# Patient Record
Sex: Female | Born: 1975 | Race: White | Hispanic: No | Smoking: Current every day smoker
Health system: Southern US, Community
[De-identification: ages and names within clinical notes are randomized; demographics above are authoritative.]

## PROBLEM LIST (undated history)

## (undated) DIAGNOSIS — F17200 Nicotine dependence, unspecified, uncomplicated: Secondary | ICD-10-CM

## (undated) DIAGNOSIS — R198 Other specified symptoms and signs involving the digestive system and abdomen: Secondary | ICD-10-CM

## (undated) DIAGNOSIS — F514 Sleep terrors [night terrors]: Secondary | ICD-10-CM

## (undated) DIAGNOSIS — M549 Dorsalgia, unspecified: Secondary | ICD-10-CM

## (undated) DIAGNOSIS — F3289 Other specified depressive episodes: Secondary | ICD-10-CM

## (undated) DIAGNOSIS — R1013 Epigastric pain: Secondary | ICD-10-CM

## (undated) DIAGNOSIS — N912 Amenorrhea, unspecified: Secondary | ICD-10-CM

## (undated) DIAGNOSIS — A5901 Trichomonal vulvovaginitis: Secondary | ICD-10-CM

## (undated) DIAGNOSIS — E785 Hyperlipidemia, unspecified: Secondary | ICD-10-CM

## (undated) DIAGNOSIS — K3189 Other diseases of stomach and duodenum: Secondary | ICD-10-CM

## (undated) DIAGNOSIS — F329 Major depressive disorder, single episode, unspecified: Secondary | ICD-10-CM

## (undated) DIAGNOSIS — F419 Anxiety disorder, unspecified: Secondary | ICD-10-CM

## (undated) DIAGNOSIS — C569 Malignant neoplasm of unspecified ovary: Secondary | ICD-10-CM

## (undated) DIAGNOSIS — Z6830 Body mass index (BMI) 30.0-30.9, adult: Secondary | ICD-10-CM

## (undated) DIAGNOSIS — R82998 Other abnormal findings in urine: Secondary | ICD-10-CM

## (undated) DIAGNOSIS — G2581 Restless legs syndrome: Secondary | ICD-10-CM

## (undated) DIAGNOSIS — F319 Bipolar disorder, unspecified: Secondary | ICD-10-CM

## (undated) DIAGNOSIS — R002 Palpitations: Secondary | ICD-10-CM

## (undated) DIAGNOSIS — J449 Chronic obstructive pulmonary disease, unspecified: Secondary | ICD-10-CM

## (undated) DIAGNOSIS — I10 Essential (primary) hypertension: Secondary | ICD-10-CM

## (undated) DIAGNOSIS — F988 Other specified behavioral and emotional disorders with onset usually occurring in childhood and adolescence: Secondary | ICD-10-CM

## (undated) DIAGNOSIS — Z87442 Personal history of urinary calculi: Secondary | ICD-10-CM

## (undated) DIAGNOSIS — F4001 Agoraphobia with panic disorder: Secondary | ICD-10-CM

## (undated) DIAGNOSIS — F603 Borderline personality disorder: Secondary | ICD-10-CM

## (undated) DIAGNOSIS — F4310 Post-traumatic stress disorder, unspecified: Secondary | ICD-10-CM

## (undated) DIAGNOSIS — N898 Other specified noninflammatory disorders of vagina: Secondary | ICD-10-CM

## (undated) DIAGNOSIS — G5631 Lesion of radial nerve, right upper limb: Secondary | ICD-10-CM

## (undated) DIAGNOSIS — G4730 Sleep apnea, unspecified: Secondary | ICD-10-CM

## (undated) DIAGNOSIS — G4700 Insomnia, unspecified: Secondary | ICD-10-CM

## (undated) DIAGNOSIS — R209 Unspecified disturbances of skin sensation: Secondary | ICD-10-CM

## (undated) DIAGNOSIS — E119 Type 2 diabetes mellitus without complications: Secondary | ICD-10-CM

## (undated) HISTORY — DX: Body mass index (BMI) 30.0-30.9, adult: Z68.30

## (undated) HISTORY — DX: Restless legs syndrome: G25.81

## (undated) HISTORY — DX: Bipolar disorder, unspecified: F31.9

## (undated) HISTORY — DX: Post-traumatic stress disorder, unspecified: F43.10

## (undated) HISTORY — DX: Essential (primary) hypertension: I10

## (undated) HISTORY — DX: Malignant neoplasm of unspecified ovary: C56.9

## (undated) HISTORY — DX: Sleep apnea, unspecified: G47.30

## (undated) HISTORY — DX: Anxiety disorder, unspecified: F41.9

## (undated) HISTORY — DX: Other diseases of stomach and duodenum: K31.89

## (undated) HISTORY — PX: INNER EAR SURGERY: SHX679

## (undated) HISTORY — DX: Borderline personality disorder: F60.3

## (undated) HISTORY — DX: Other abnormal findings in urine: R82.998

## (undated) HISTORY — DX: Agoraphobia with panic disorder: F40.01

## (undated) HISTORY — DX: Other diseases of stomach and duodenum: R10.13

## (undated) HISTORY — DX: Trichomonal vulvovaginitis: A59.01

## (undated) HISTORY — DX: Other specified symptoms and signs involving the digestive system and abdomen: R19.8

## (undated) HISTORY — DX: Amenorrhea, unspecified: N91.2

## (undated) HISTORY — DX: Unspecified disturbances of skin sensation: R20.9

## (undated) HISTORY — DX: Other specified noninflammatory disorders of vagina: N89.8

## (undated) HISTORY — PX: KNEE CARTILAGE SURGERY: SHX688

## (undated) HISTORY — DX: Lesion of radial nerve, right upper limb: G56.31

## (undated) HISTORY — DX: Hyperlipidemia, unspecified: E78.5

## (undated) HISTORY — DX: Major depressive disorder, single episode, unspecified: F32.9

## (undated) HISTORY — DX: Nicotine dependence, unspecified, uncomplicated: F17.200

## (undated) HISTORY — DX: Dorsalgia, unspecified: M54.9

## (undated) HISTORY — DX: Other specified behavioral and emotional disorders with onset usually occurring in childhood and adolescence: F98.8

## (undated) HISTORY — DX: Sleep terrors (night terrors): F51.4

## (undated) HISTORY — DX: Other specified depressive episodes: F32.89

## (undated) HISTORY — DX: Palpitations: R00.2

## (undated) HISTORY — DX: Insomnia, unspecified: G47.00

---

## 1997-09-25 ENCOUNTER — Encounter: Payer: Self-pay | Admitting: Emergency Medicine

## 1997-09-25 ENCOUNTER — Emergency Department (HOSPITAL_COMMUNITY): Admission: EM | Admit: 1997-09-25 | Discharge: 1997-09-25 | Payer: Self-pay | Admitting: Emergency Medicine

## 1997-12-25 ENCOUNTER — Emergency Department (HOSPITAL_COMMUNITY): Admission: EM | Admit: 1997-12-25 | Discharge: 1997-12-25 | Payer: Self-pay | Admitting: Emergency Medicine

## 1998-07-29 ENCOUNTER — Encounter: Admission: RE | Admit: 1998-07-29 | Discharge: 1998-07-29 | Payer: Self-pay | Admitting: Obstetrics

## 1998-08-28 ENCOUNTER — Inpatient Hospital Stay (HOSPITAL_COMMUNITY): Admission: AD | Admit: 1998-08-28 | Discharge: 1998-08-28 | Payer: Self-pay | Admitting: Obstetrics

## 1998-10-21 ENCOUNTER — Other Ambulatory Visit: Admission: RE | Admit: 1998-10-21 | Discharge: 1998-10-21 | Payer: Self-pay | Admitting: *Deleted

## 1998-10-21 ENCOUNTER — Encounter: Admission: RE | Admit: 1998-10-21 | Discharge: 1998-10-21 | Payer: Self-pay | Admitting: Obstetrics

## 1998-11-04 ENCOUNTER — Encounter: Admission: RE | Admit: 1998-11-04 | Discharge: 1998-11-04 | Payer: Self-pay | Admitting: Obstetrics

## 1999-01-25 ENCOUNTER — Other Ambulatory Visit: Admission: RE | Admit: 1999-01-25 | Discharge: 1999-01-25 | Payer: Self-pay | Admitting: Obstetrics

## 1999-01-25 ENCOUNTER — Encounter: Admission: RE | Admit: 1999-01-25 | Discharge: 1999-01-25 | Payer: Self-pay | Admitting: Obstetrics & Gynecology

## 1999-06-20 ENCOUNTER — Other Ambulatory Visit: Admission: RE | Admit: 1999-06-20 | Discharge: 1999-06-20 | Payer: Self-pay | Admitting: Obstetrics

## 1999-06-21 ENCOUNTER — Encounter: Admission: RE | Admit: 1999-06-21 | Discharge: 1999-06-21 | Payer: Self-pay | Admitting: Obstetrics & Gynecology

## 1999-06-21 ENCOUNTER — Other Ambulatory Visit: Admission: RE | Admit: 1999-06-21 | Discharge: 1999-06-21 | Payer: Self-pay | Admitting: Obstetrics

## 1999-07-12 ENCOUNTER — Encounter: Admission: RE | Admit: 1999-07-12 | Discharge: 1999-07-12 | Payer: Self-pay | Admitting: Obstetrics

## 1999-09-15 ENCOUNTER — Other Ambulatory Visit: Admission: RE | Admit: 1999-09-15 | Discharge: 1999-09-15 | Payer: Self-pay | Admitting: *Deleted

## 1999-09-19 ENCOUNTER — Encounter: Payer: Self-pay | Admitting: Family Medicine

## 1999-09-19 ENCOUNTER — Ambulatory Visit (HOSPITAL_COMMUNITY): Admission: RE | Admit: 1999-09-19 | Discharge: 1999-09-19 | Payer: Self-pay | Admitting: Family Medicine

## 2000-01-31 ENCOUNTER — Encounter: Admission: RE | Admit: 2000-01-31 | Discharge: 2000-01-31 | Payer: Self-pay | Admitting: Obstetrics & Gynecology

## 2000-03-22 ENCOUNTER — Encounter: Admission: RE | Admit: 2000-03-22 | Discharge: 2000-03-22 | Payer: Self-pay | Admitting: Obstetrics

## 2000-07-13 ENCOUNTER — Encounter: Admission: RE | Admit: 2000-07-13 | Discharge: 2000-07-13 | Payer: Self-pay | Admitting: Family Medicine

## 2000-07-13 ENCOUNTER — Encounter: Payer: Self-pay | Admitting: Family Medicine

## 2001-03-08 ENCOUNTER — Encounter: Payer: Self-pay | Admitting: Family Medicine

## 2001-03-08 ENCOUNTER — Encounter: Admission: RE | Admit: 2001-03-08 | Discharge: 2001-03-08 | Payer: Self-pay | Admitting: Family Medicine

## 2001-05-22 ENCOUNTER — Other Ambulatory Visit: Admission: RE | Admit: 2001-05-22 | Discharge: 2001-05-22 | Payer: Self-pay | Admitting: Obstetrics

## 2001-06-16 ENCOUNTER — Encounter: Admission: RE | Admit: 2001-06-16 | Discharge: 2001-06-16 | Payer: Self-pay | Admitting: Orthopedic Surgery

## 2001-06-16 ENCOUNTER — Encounter: Payer: Self-pay | Admitting: Orthopedic Surgery

## 2001-07-02 ENCOUNTER — Encounter: Admission: RE | Admit: 2001-07-02 | Discharge: 2001-07-18 | Payer: Self-pay | Admitting: Orthopedic Surgery

## 2002-02-09 ENCOUNTER — Emergency Department (HOSPITAL_COMMUNITY): Admission: EM | Admit: 2002-02-09 | Discharge: 2002-02-09 | Payer: Self-pay | Admitting: Emergency Medicine

## 2002-04-03 ENCOUNTER — Emergency Department (HOSPITAL_COMMUNITY): Admission: EM | Admit: 2002-04-03 | Discharge: 2002-04-03 | Payer: Self-pay | Admitting: Emergency Medicine

## 2002-07-21 ENCOUNTER — Other Ambulatory Visit: Admission: RE | Admit: 2002-07-21 | Discharge: 2002-07-21 | Payer: Self-pay | Admitting: Family Medicine

## 2003-04-11 ENCOUNTER — Emergency Department (HOSPITAL_COMMUNITY): Admission: EM | Admit: 2003-04-11 | Discharge: 2003-04-12 | Payer: Self-pay | Admitting: Emergency Medicine

## 2003-08-25 ENCOUNTER — Emergency Department (HOSPITAL_COMMUNITY): Admission: EM | Admit: 2003-08-25 | Discharge: 2003-08-25 | Payer: Self-pay | Admitting: Emergency Medicine

## 2003-08-28 ENCOUNTER — Other Ambulatory Visit: Admission: RE | Admit: 2003-08-28 | Discharge: 2003-08-28 | Payer: Self-pay | Admitting: Family Medicine

## 2003-08-29 ENCOUNTER — Emergency Department (HOSPITAL_COMMUNITY): Admission: EM | Admit: 2003-08-29 | Discharge: 2003-08-29 | Payer: Self-pay | Admitting: Emergency Medicine

## 2003-09-02 ENCOUNTER — Encounter (HOSPITAL_COMMUNITY): Admission: RE | Admit: 2003-09-02 | Discharge: 2003-10-29 | Payer: Self-pay | Admitting: Emergency Medicine

## 2004-09-13 ENCOUNTER — Other Ambulatory Visit: Admission: RE | Admit: 2004-09-13 | Discharge: 2004-09-13 | Payer: Self-pay | Admitting: Family Medicine

## 2004-09-15 ENCOUNTER — Encounter: Admission: RE | Admit: 2004-09-15 | Discharge: 2004-09-15 | Payer: Self-pay | Admitting: Family Medicine

## 2005-03-04 ENCOUNTER — Emergency Department (HOSPITAL_COMMUNITY): Admission: EM | Admit: 2005-03-04 | Discharge: 2005-03-05 | Payer: Self-pay | Admitting: Emergency Medicine

## 2007-01-07 ENCOUNTER — Encounter (INDEPENDENT_AMBULATORY_CARE_PROVIDER_SITE_OTHER): Payer: Self-pay | Admitting: Otolaryngology

## 2007-01-07 ENCOUNTER — Ambulatory Visit (HOSPITAL_BASED_OUTPATIENT_CLINIC_OR_DEPARTMENT_OTHER): Admission: RE | Admit: 2007-01-07 | Discharge: 2007-01-07 | Payer: Self-pay | Admitting: Otolaryngology

## 2007-01-24 HISTORY — PX: OTHER SURGICAL HISTORY: SHX169

## 2008-05-01 ENCOUNTER — Other Ambulatory Visit: Admission: RE | Admit: 2008-05-01 | Discharge: 2008-05-01 | Payer: Self-pay | Admitting: Obstetrics and Gynecology

## 2008-07-29 ENCOUNTER — Emergency Department (HOSPITAL_COMMUNITY): Admission: EM | Admit: 2008-07-29 | Discharge: 2008-07-29 | Payer: Self-pay | Admitting: Emergency Medicine

## 2009-05-10 ENCOUNTER — Emergency Department (HOSPITAL_COMMUNITY): Admission: EM | Admit: 2009-05-10 | Discharge: 2009-05-10 | Payer: Self-pay | Admitting: Emergency Medicine

## 2009-07-05 ENCOUNTER — Emergency Department (HOSPITAL_COMMUNITY): Admission: EM | Admit: 2009-07-05 | Discharge: 2009-07-06 | Payer: Self-pay | Admitting: Emergency Medicine

## 2009-09-01 ENCOUNTER — Emergency Department (HOSPITAL_COMMUNITY): Admission: EM | Admit: 2009-09-01 | Discharge: 2009-09-01 | Payer: Self-pay | Admitting: Family Medicine

## 2010-04-08 LAB — URINALYSIS, ROUTINE W REFLEX MICROSCOPIC
Glucose, UA: NEGATIVE mg/dL
Ketones, ur: NEGATIVE mg/dL
Protein, ur: NEGATIVE mg/dL
Specific Gravity, Urine: 1.028 (ref 1.005–1.030)

## 2010-04-08 LAB — POCT URINALYSIS DIPSTICK
Glucose, UA: NEGATIVE mg/dL
Ketones, ur: NEGATIVE mg/dL
Specific Gravity, Urine: >=1.03 (ref 1.005–1.030)

## 2010-04-08 LAB — URINE CULTURE
Colony Count: 50000
Culture  Setup Time: 201108101946

## 2010-04-08 LAB — URINE MICROSCOPIC-ADD ON

## 2010-04-11 LAB — URINE MICROSCOPIC-ADD ON

## 2010-04-11 LAB — URINALYSIS, ROUTINE W REFLEX MICROSCOPIC
Bilirubin Urine: NEGATIVE
Glucose, UA: NEGATIVE mg/dL
Ketones, ur: NEGATIVE mg/dL
Nitrite: POSITIVE — AB
Specific Gravity, Urine: 1.015 (ref 1.005–1.030)
Urobilinogen, UA: 1 mg/dL (ref 0.0–1.0)

## 2010-04-11 LAB — URINE CULTURE

## 2010-06-07 NOTE — Op Note (Signed)
NAME:  Judy Bailey, Judy Bailey                ACCOUNT NO.:  [id number]   MEDICAL RECORD NO.:  [id number]          PATIENT TYPE:  AMB   LOCATION:  DSC                          FACILITY:  [facility]   PHYSICIAN:  Kinnie Scales. Annalee Genta, M.D.DATE OF BIRTH:  1975-06-16   DATE OF PROCEDURE:  01/07/2007  DATE OF DISCHARGE:                               OPERATIVE REPORT   PREOPERATIVE DIAGNOSIS:  Chronic tonsillitis.   POSTOPERATIVE DIAGNOSIS:  Chronic tonsillitis.   INDICATIONS FOR SURGERY:  Chronic tonsillitis.   SURGICAL PROCEDURES:  Tonsillectomy.   SURGEON:  Kinnie Scales. Annalee Genta, M.D.   ANESTHESIA:  General endotracheal.   COMPLICATIONS:  None.   ESTIMATED BLOOD LOSS:  Minimal.   DISPOSITION:  The patient transferred from the operating room to the  recovery room in stable condition.   BRIEF HISTORY:  Ms. Colello is a 35 year old white female with a history  of chronic cryptic tonsillitis.  She complains of chronic tonsil  discharge and sore throat.  Given her history and physical examination  which showed somewhat enlarged cryptic tonsils, I recommended that we  undertake tonsillectomy under general anesthesia.  The risks, benefits,  and possible complications of the procedure were discussed in detail,  and the patient understood and concurred with our plan for surgery which  is scheduled as an outpatient under general anesthesia.   PROCEDURE:  The patient was brought to the operating room on January 07, 2007 and placed in the supine position on the operating table.  General endotracheal anesthesia was established without difficulty.  When the patient was adequately anesthetized, her oral cavity and  oropharynx were examined.  There were no loose or broken teeth, and the  hard and soft palate were intact.   The surgical procedure was begun with tonsillectomy on the left-hand  side.  Using Bovie electrocautery and dissecting in subcapsular fashion,  the entire left tonsil was resected from  superior pole to tongue base.  The right tonsil was removed in a similar fashion.  Tonsil tissue was  sent to pathology for gross and microscopic evaluation.  The tonsillar  fossa was gently abraded with a dry tonsil sponge, and several small  areas of point hemorrhage were cauterized with suction cautery.  The  mouthgag was released and reapplied.  No active bleeding.  An orogastric  tube was passed, and the stomach contents were aspirated.   The patient was then awakened from her anesthetic, extubated, and was  transferred from the operating room to the recovery room in stable  condition.           ______________________________  Kinnie Scales Annalee Genta, M.D.     DLS/MEDQ  D:  04/54/0981  T:  01/07/2007  Job:  [id number]

## 2010-08-18 ENCOUNTER — Emergency Department (HOSPITAL_COMMUNITY)
Admission: EM | Admit: 2010-08-18 | Discharge: 2010-08-18 | Disposition: A | Discharge status: Home / Self Care | Payer: Medicaid Other | Attending: Emergency Medicine | Admitting: Emergency Medicine

## 2010-08-18 ENCOUNTER — Emergency Department (HOSPITAL_COMMUNITY): Payer: Medicaid Other

## 2010-08-18 DIAGNOSIS — R0789 Other chest pain: Secondary | ICD-10-CM | POA: Insufficient documentation

## 2010-08-18 DIAGNOSIS — M79609 Pain in unspecified limb: Secondary | ICD-10-CM | POA: Insufficient documentation

## 2010-08-18 DIAGNOSIS — R0989 Other specified symptoms and signs involving the circulatory and respiratory systems: Secondary | ICD-10-CM | POA: Insufficient documentation

## 2010-08-18 DIAGNOSIS — G4730 Sleep apnea, unspecified: Secondary | ICD-10-CM | POA: Insufficient documentation

## 2010-08-18 DIAGNOSIS — R0609 Other forms of dyspnea: Secondary | ICD-10-CM | POA: Insufficient documentation

## 2010-08-18 DIAGNOSIS — F988 Other specified behavioral and emotional disorders with onset usually occurring in childhood and adolescence: Secondary | ICD-10-CM | POA: Insufficient documentation

## 2010-08-18 DIAGNOSIS — M940 Chondrocostal junction syndrome [Tietze]: Secondary | ICD-10-CM | POA: Insufficient documentation

## 2010-08-18 LAB — COMPREHENSIVE METABOLIC PANEL
Alkaline Phosphatase: 79 U/L (ref 39–117)
BUN: 8 mg/dL (ref 6–23)
CO2: 27 mEq/L (ref 19–32)
Chloride: 103 mEq/L (ref 96–112)
GFR calc Af Amer: >60 mL/min (ref >60)
Glucose, Bld: 121 mg/dL — ABNORMAL HIGH (ref 70–99)
Total Bilirubin: 0.3 mg/dL (ref 0.3–1.2)

## 2010-08-18 LAB — CBC
HCT: 40.8 % (ref 36.0–46.0)
MCHC: 35.3 g/dL (ref 30.0–36.0)
MCV: 94.7 fL (ref 78.0–100.0)
Platelets: 260 10*3/uL (ref 150–400)
RBC: 4.31 MIL/uL (ref 3.87–5.11)
WBC: 7.9 10*3/uL (ref 4.0–10.5)

## 2010-08-18 LAB — DIFFERENTIAL
Lymphocytes Relative: 28 % (ref 12–46)
Monocytes Relative: 7 % (ref 3–12)
Neutrophils Relative %: 63 % (ref 43–77)

## 2010-08-18 LAB — CK TOTAL AND CKMB (NOT AT ARMC): Total CK: 61 U/L (ref 7–177)

## 2011-06-13 DIAGNOSIS — F329 Major depressive disorder, single episode, unspecified: Secondary | ICD-10-CM | POA: Insufficient documentation

## 2011-06-13 DIAGNOSIS — G4700 Insomnia, unspecified: Secondary | ICD-10-CM | POA: Insufficient documentation

## 2011-06-14 DIAGNOSIS — K319 Disease of stomach and duodenum, unspecified: Secondary | ICD-10-CM | POA: Insufficient documentation

## 2011-06-22 ENCOUNTER — Other Ambulatory Visit: Payer: Self-pay | Admitting: Cardiology

## 2011-06-22 ENCOUNTER — Ambulatory Visit
Admission: RE | Admit: 2011-06-22 | Discharge: 2011-06-22 | Disposition: A | Discharge status: Home / Self Care | Payer: Medicaid Other | Source: Ambulatory Visit | Attending: Cardiology | Admitting: Cardiology

## 2011-06-22 DIAGNOSIS — R0789 Other chest pain: Secondary | ICD-10-CM

## 2011-07-05 ENCOUNTER — Other Ambulatory Visit: Payer: Self-pay | Admitting: Physician Assistant

## 2012-02-07 DIAGNOSIS — M549 Dorsalgia, unspecified: Secondary | ICD-10-CM | POA: Insufficient documentation

## 2012-02-09 ENCOUNTER — Encounter: Payer: Self-pay | Admitting: Gastroenterology

## 2012-02-09 DIAGNOSIS — R259 Unspecified abnormal involuntary movements: Secondary | ICD-10-CM | POA: Insufficient documentation

## 2012-02-19 ENCOUNTER — Encounter: Payer: Self-pay | Admitting: Gastroenterology

## 2012-02-19 ENCOUNTER — Ambulatory Visit (INDEPENDENT_AMBULATORY_CARE_PROVIDER_SITE_OTHER): Payer: Medicaid Other | Admitting: Gastroenterology

## 2012-02-19 VITALS — BP 92/60 | HR 116 | Ht 67.5 in | Wt 202.4 lb

## 2012-02-19 DIAGNOSIS — K625 Hemorrhage of anus and rectum: Secondary | ICD-10-CM

## 2012-02-19 DIAGNOSIS — R198 Other specified symptoms and signs involving the digestive system and abdomen: Secondary | ICD-10-CM

## 2012-02-19 MED ORDER — HYOSCYAMINE SULFATE ER 0.375 MG PO TB12: 0.3750 mg | ORAL_TABLET | Freq: Two times a day (BID) | ORAL | Status: DC

## 2012-02-19 MED ORDER — PEG-KCL-NACL-NASULF-NA ASC-C 100 G PO SOLR: 1.0000 | Freq: Once | ORAL | Status: DC

## 2012-02-19 NOTE — Progress Notes (Signed)
HPI: This is a  very pleasant 37 year old woman whom I am meeting for the first time today.  She had a colonoscopy here in [place] 4-5 years ago, she was told it was normal.    She gained 30 pounds throughout the month of December she went from 175 to 215 and swears she was not eating too much.  She has lost some of it but still up about 25-30 pounds.    She has been cutting back on cigarette smoking. She started Depakote about 4 months ago. We looked at the side effect profile of this and weight changes is listed however it is very low on the list.  Was having mucous discharge from anus, every time she would urinate.  Now has mixed mucous and stool.   She usually has BM about 3 times per week, now much more frequent with mucous and blood mixed.  She has lower abd.  Pain, pressure in abd.  No period in 6 months but her PCP knows about it, thinks she will resume soon since Depo shots stopped. She had blood work done earlier this month and her CBC was normal. Complete metabolic profile was essentially normal and she did have a urinary tract infection with Escherichia coli stool testing was done and it showed C. difficile toxin test was negative, routine stool culture was also negative.  Review of systems: Pertinent positive and negative review of systems were noted in the above HPI section. Complete review of systems was performed and was otherwise normal.    Past Medical History  Diagnosis Date  . Other and unspecified hyperlipidemia   . Unspecified essential hypertension   . Unspecified sleep apnea   . Backache, unspecified   . Other nonspecific finding on examination of urine   . Leukorrhea, not specified as infective   . Other symptoms involving digestive system   . Absence of menstruation   . Body mass index 30.0-30.9, adult   . Tobacco use disorder   . Palpitations   . Dyspepsia and other specified disorders of function of stomach   . Trichomonal vulvovaginitis   . Restless legs  syndrome (RLS)   . Insomnia, unspecified   . Depressive disorder, not elsewhere classified   . Attention deficit disorder without mention of hyperactivity   . Agoraphobia with panic disorder   . Disturbance of skin sensation   . Ovarian cancer   . Anxiety   . Bipolar disorder   . Night terrors   . PTSD (post-traumatic stress disorder)   . Borderline personality disorder     Past Surgical History  Procedure Date  . Tonsillectomy   . Tympanic membrane 2009  . Knee cartilage surgery     right    Current Outpatient Prescriptions  Medication Sig Dispense Refill  . ALPRAZolam (XANAX) 1 MG tablet Take 1 mg by mouth at bedtime as needed.      Marland Kitchen amphetamine-dextroamphetamine (ADDERALL XR) 20 MG 24 hr capsule Take 20 mg by mouth every morning.      Marland Kitchen amphetamine-dextroamphetamine (ADDERALL) 10 MG tablet Take 10 mg by mouth daily.      . ARIPiprazole (ABILIFY) 10 MG tablet Take 10 mg by mouth 3 (three) times daily.      Marland Kitchen atorvastatin (LIPITOR) 40 MG tablet Take 40 mg by mouth daily.      Marland Kitchen desvenlafaxine (PRISTIQ) 100 MG 24 hr tablet Take 100 mg by mouth daily.      . divalproex (DEPAKOTE) 250 MG DR tablet Take  250 mg by mouth daily.      . divalproex (DEPAKOTE) 500 MG DR tablet Take 500 mg by mouth daily.      Marland Kitchen omeprazole (PRILOSEC) 40 MG capsule Take 40 mg by mouth daily.      . propranolol ER (INDERAL LA) 120 MG 24 hr capsule Take 120 mg by mouth daily.        Allergies as of 02/19/2012  . (No Known Allergies)    Family History  Problem Relation Age of Onset  . Prostate cancer Maternal Grandfather   . Breast cancer Paternal Grandmother   . Alzheimer's disease Paternal Grandfather   . Hypertension Brother   . Hypertension Father   . Diabetes Father   . Heart disease Father   . Depression Father   . Bipolar disorder Sister   . Colon polyps Brother   . Colon polyps Father   . Colon polyps Mother   . Clotting disorder Maternal Grandmother   . Other Father     mad cow  disease  . Parkinson's disease Paternal Grandfather   . Liver disease Maternal Grandfather   . Kidney disease Mother   . Irritable bowel syndrome Sister   . Irritable bowel syndrome Paternal Aunt   . Diabetes Paternal Grandmother     History   Social History  . Marital Status: Divorced    Spouse Name: N/A    Number of Children: 1  . 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 Smoking status: Current Every Day Smoker -- 0.5 packs/day for 22 years    Types: Cigarettes  . Smokeless tobacco: Never Used  . Alcohol Use: No  . Drug Use: No  . Sexually Active: Not on file   Other Topics Concern  . Not on file   Social History Narrative  . No narrative on file       Physical Exam: BP 92/60  Pulse 116  Ht 5' 7.5" (1.715 m)  Wt 202 lb 6 oz (91.797 kg)  BMI 31.23 kg/m2 Constitutional: generally well-appearing Psychiatric: alert and oriented x3 Eyes: extraocular movements intact Mouth: oral pharynx moist, no lesions Neck: supple no lymphadenopathy Cardiovascular: heart regular rate and rhythm Lungs: clear to auscultation bilaterally Abdomen: soft, nontender, nondistended, no obvious ascites, no peritoneal signs, normal bowel sounds Extremities: no lower extremity edema bilaterally Skin: no lesions on visible extremities    Assessment and plan: 37 y.o. female with  new change in bowel habits, minor rectal bleeding, weight gain.  First I cannot explain her weight gain. Perhaps is related to her cigarette smoking cessation.  She has had a significant change in her bowel habits including frequency, mucous discharge, blood in her stool and for that reason we should proceed with colonoscopy at her soonest convenience. She is on also psychiatric medicines I think to be safest if we do this with MAC type sedation. She has had a colonoscopy before however was 4-5 years ago. We will try to track down those records from [facility]  gastroenterology.

## 2012-02-19 NOTE — Patient Instructions (Addendum)
One of your biggest health concerns is your smoking.  This increases your risk for most cancers and serious cardiovascular diseases such as strokes, heart attacks.  You should try your best to stop.  If you need assistance, please contact your PCP or Smoking Cessation Class at [facility] [phone]) or [employer] Quit-Line (1-800-QUIT-NOW). We will try to track down the colonoscopy you had 4-5 years ago (likely at [facility]), also any pathology results. You will be set up for a colonoscopy (MAC sedation, LEC) for change in bowels, minor rectal bleeding. Trial of antispasm (take one pill twice daily).

## 2012-02-20 ENCOUNTER — Telehealth: Payer: Self-pay

## 2012-02-20 ENCOUNTER — Telehealth: Payer: Self-pay | Admitting: Gastroenterology

## 2012-02-20 NOTE — Telephone Encounter (Signed)
Pt will have the pharmacy send a prior auth for the levbid

## 2012-02-20 NOTE — Telephone Encounter (Signed)
Dr Christella Hartigan the pt called and states the insurance will not cover her Levsin, I can not even do a prior auth,  Can we send something else?

## 2012-02-21 MED ORDER — GLYCOPYRROLATE 2 MG PO TABS: 2.0000 mg | ORAL_TABLET | Freq: Two times a day (BID) | ORAL | Status: DC

## 2012-02-21 NOTE — Telephone Encounter (Signed)
Ok, I called in robinul instead.

## 2012-02-21 NOTE — Telephone Encounter (Signed)
Pt has been notified.

## 2012-02-23 ENCOUNTER — Other Ambulatory Visit: Payer: Self-pay | Admitting: Gastroenterology

## 2012-02-23 ENCOUNTER — Encounter: Payer: Self-pay | Admitting: Gastroenterology

## 2012-02-23 ENCOUNTER — Ambulatory Visit (AMBULATORY_SURGERY_CENTER): Payer: Medicaid Other | Admitting: Gastroenterology

## 2012-02-23 VITALS — BP 118/71 | HR 72 | Temp 98.5°F | Resp 22 | Ht 67.0 in | Wt 202.0 lb

## 2012-02-23 DIAGNOSIS — K625 Hemorrhage of anus and rectum: Secondary | ICD-10-CM

## 2012-02-23 DIAGNOSIS — K5289 Other specified noninfective gastroenteritis and colitis: Secondary | ICD-10-CM

## 2012-02-23 DIAGNOSIS — R197 Diarrhea, unspecified: Secondary | ICD-10-CM

## 2012-02-23 DIAGNOSIS — R933 Abnormal findings on diagnostic imaging of other parts of digestive tract: Secondary | ICD-10-CM

## 2012-02-23 DIAGNOSIS — R198 Other specified symptoms and signs involving the digestive system and abdomen: Secondary | ICD-10-CM

## 2012-02-23 MED ORDER — METRONIDAZOLE 250 MG PO TABS: 500.0000 mg | ORAL_TABLET | Freq: Three times a day (TID) | ORAL | Status: AC

## 2012-02-23 MED ORDER — SODIUM CHLORIDE 0.9 % IV SOLN: 500.0000 mL | INTRAVENOUS | Status: DC

## 2012-02-23 NOTE — Progress Notes (Signed)
Called to room to assist during endoscopic procedure.  Patient ID and intended procedure confirmed with present staff. Received instructions for my participation in the procedure from the performing physician.  

## 2012-02-23 NOTE — Progress Notes (Signed)
Patient did not experience any of the following events: a burn prior to discharge; a fall within the facility; wrong site/side/patient/procedure/implant event; or a hospital transfer or hospital admission upon discharge from the facility. (G8907) Patient did not have preoperative order for IV antibiotic SSI prophylaxis. (G8918)  

## 2012-02-23 NOTE — Progress Notes (Signed)
Report to pacu RN, vss, bbs=clear 

## 2012-02-23 NOTE — Op Note (Signed)
[facility] [address] [place], [address]   COLONOSCOPY PROCEDURE REPORT  PATIENT: Judy, Bailey  MR#: [id number] BIRTHDATE: 05/03/1975 , 36  yrs. old GENDER: Female ENDOSCOPIST: Rachael Fee, MD REFERRED BY: Zoe Lan, MD PROCEDURE DATE:  02/23/2012 PROCEDURE:   Colonoscopy with biopsy ASA CLASS:   Class III INDICATIONS:change in bowels, looser, frequent stools. MEDICATIONS: propofol (Diprivan) 300mg  IV and MAC sedation, administered by CRNA  DESCRIPTION OF PROCEDURE:   After the risks benefits and alternatives of the procedure were thoroughly explained, informed consent was obtained.  A digital rectal exam revealed no abnormalities of the rectum.   The LB [id number]-[id number]  endoscope was introduced through the anus and advanced to the terminal ileum which was intubated for a short distance. No adverse events experienced.   The quality of the prep was good, using MoviPrep The instrument was then slowly withdrawn as the colon was fully examined.  COLON FINDINGS: The terminal ileum was normal.  There were several scattered whitish, yellow plaques throughout colon with underlying moderate inflamation.  This was most prominant in the cecum where the change covered the mucosa.  Stool effluent was sent for C. diff by PCR testing and biopseis were taken, sent to pathology. The examination was otherwise normal.  Retroflexed views revealed no abnormalities. The time to cecum=3 minutes 38 seconds. Withdrawal time=9 minutes 35 seconds.  The scope was withdrawn and the procedure completed. COMPLICATIONS: There were no complications.  ENDOSCOPIC IMPRESSION: The terminal ileum was normal.  There were several scattered whitish, yellow plaques throughout colon with underlying moderate inflamation.  This was most prominant in the cecum where the change covered the mucosa.  Stool effluent was sent for C.  diff by PCR testing and biopseis were taken, sent to  pathology.  The examination was otherwise normal.  RECOMMENDATIONS: Please start flagyl (one pill three times a day for 2 weeks, this was called into your pharmacy) for presumed C.  difficile infection.   eSigned:  Rachael Fee, MD 02/23/2012 2:29 PM

## 2012-02-23 NOTE — Patient Instructions (Addendum)

## 2012-02-26 ENCOUNTER — Telehealth: Payer: Self-pay | Admitting: *Deleted

## 2012-02-26 NOTE — Telephone Encounter (Signed)
No answer, message left for the patient. 

## 2012-03-16 ENCOUNTER — Telehealth: Payer: Self-pay | Admitting: Gastroenterology

## 2012-03-16 NOTE — Telephone Encounter (Signed)
Large packet from [place] PCP, no mention of colonoscopy in the packet anywhere

## 2012-04-26 ENCOUNTER — Ambulatory Visit (INDEPENDENT_AMBULATORY_CARE_PROVIDER_SITE_OTHER): Payer: Medicaid Other | Admitting: Psychiatry

## 2012-04-26 ENCOUNTER — Encounter (HOSPITAL_COMMUNITY): Payer: Self-pay | Admitting: Psychiatry

## 2012-04-26 VITALS — BP 104/73 | HR 88 | Wt 204.0 lb

## 2012-04-26 DIAGNOSIS — F909 Attention-deficit hyperactivity disorder, unspecified type: Secondary | ICD-10-CM

## 2012-04-26 DIAGNOSIS — F3162 Bipolar disorder, current episode mixed, moderate: Secondary | ICD-10-CM

## 2012-04-26 DIAGNOSIS — Z9889 Other specified postprocedural states: Secondary | ICD-10-CM | POA: Insufficient documentation

## 2012-04-26 DIAGNOSIS — F319 Bipolar disorder, unspecified: Secondary | ICD-10-CM

## 2012-04-26 DIAGNOSIS — E785 Hyperlipidemia, unspecified: Secondary | ICD-10-CM | POA: Insufficient documentation

## 2012-04-26 DIAGNOSIS — G4733 Obstructive sleep apnea (adult) (pediatric): Secondary | ICD-10-CM | POA: Insufficient documentation

## 2012-04-26 MED ORDER — DESVENLAFAXINE SUCCINATE ER 100 MG PO TB24: 100.0000 mg | ORAL_TABLET | Freq: Every day | ORAL | Status: DC

## 2012-04-26 MED ORDER — ARIPIPRAZOLE 10 MG PO TABS: ORAL_TABLET | ORAL | Status: DC

## 2012-04-26 MED ORDER — DIVALPROEX SODIUM 250 MG PO DR TAB: DELAYED_RELEASE_TABLET | ORAL | Status: DC

## 2012-04-26 MED ORDER — AMPHETAMINE-DEXTROAMPHET ER 30 MG PO CP24: 30.0000 mg | ORAL_CAPSULE | Freq: Every day | ORAL | Status: DC
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
## 2012-04-30 ENCOUNTER — Telehealth (HOSPITAL_COMMUNITY): Payer: Self-pay | Admitting: *Deleted

## 2012-04-30 ENCOUNTER — Other Ambulatory Visit (HOSPITAL_COMMUNITY): Payer: Self-pay | Admitting: Psychiatry

## 2012-04-30 NOTE — Telephone Encounter (Signed)
Pt left VM:Saw Dr.Arfeen Friday 4/4.Could not remember one of her medicines.He asked her to call with info.It is Temazepam 30 mg,1 at bedtime. Medication abstracted to chart

## 2012-05-07 ENCOUNTER — Ambulatory Visit: Payer: Medicaid Other | Admitting: Diagnostic Neuroimaging

## 2012-05-07 ENCOUNTER — Ambulatory Visit (INDEPENDENT_AMBULATORY_CARE_PROVIDER_SITE_OTHER): Payer: Medicaid Other | Admitting: Marriage and Family Therapist

## 2012-05-07 DIAGNOSIS — F4310 Post-traumatic stress disorder, unspecified: Secondary | ICD-10-CM

## 2012-05-07 DIAGNOSIS — F909 Attention-deficit hyperactivity disorder, unspecified type: Secondary | ICD-10-CM

## 2012-05-07 DIAGNOSIS — F319 Bipolar disorder, unspecified: Secondary | ICD-10-CM
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
## 2012-05-09 ENCOUNTER — Ambulatory Visit (HOSPITAL_COMMUNITY): Payer: Self-pay | Admitting: Marriage and Family Therapist

## 2012-05-09 ENCOUNTER — Other Ambulatory Visit (HOSPITAL_COMMUNITY): Payer: Self-pay | Admitting: Psychiatry

## 2012-05-09 ENCOUNTER — Telehealth (HOSPITAL_COMMUNITY): Payer: Self-pay | Admitting: *Deleted

## 2012-05-09 ENCOUNTER — Telehealth (HOSPITAL_COMMUNITY): Payer: Self-pay | Admitting: Marriage and Family Therapist

## 2012-05-09 NOTE — Telephone Encounter (Signed)
See contact note 

## 2012-05-09 NOTE — Telephone Encounter (Signed)
Patient reports due to recent felony charges her pharmacy is refusing to give her her prescription from Dr. Lolly Mustache for Xanax.  Talked to Cristela Felt, RN relating to calling patient regarding same.  Cleophas Dunker, LMFT, CTS

## 2012-05-15 ENCOUNTER — Telehealth (HOSPITAL_COMMUNITY): Payer: Self-pay | Admitting: *Deleted

## 2012-05-15 ENCOUNTER — Other Ambulatory Visit (HOSPITAL_COMMUNITY): Payer: Self-pay | Admitting: Psychiatry

## 2012-05-15 ENCOUNTER — Ambulatory Visit (INDEPENDENT_AMBULATORY_CARE_PROVIDER_SITE_OTHER): Payer: Medicaid Other | Admitting: Marriage and Family Therapist

## 2012-05-15 DIAGNOSIS — F319 Bipolar disorder, unspecified: Secondary | ICD-10-CM

## 2012-05-15 DIAGNOSIS — F4310 Post-traumatic stress disorder, unspecified: Secondary | ICD-10-CM

## 2012-05-15 DIAGNOSIS — F909 Attention-deficit hyperactivity disorder, unspecified type: Secondary | ICD-10-CM

## 2012-05-15 NOTE — Telephone Encounter (Signed)
Contacted [facility] after recv'd fax [phone].Informed pharmacist that patient was seen by Dr.Arfeen for first time in this office on 04/26/12. Per pharmacist Chrissy, prescription has has [facility] on prescription. MD name Dr.Ikwechegh is marked through.Dr.Arfeen's name is hand written with a DEA number. Pharmacist states it was originally filled 03/30/12.  Pharmacist faxed RX over for Dr.Arfeen to review. Per Dr.Arfeen he is the Clinical research associate of this prescription written when he was providing coverage for the provider named on prescription.  Dr.Arfeen asked that pt be contacted and informed/reminded that in appointment 04/26/12, he instructed patient to stop Xanax, and that he will not refill this medicine. Contacted pt [phone] and gave pt information as per Dr.Arfeen. Patient verbalized understanding. States she is still having panic attacks and will discuss medication options with Dr.ARfeen at appointment on 05/26/12.

## 2012-05-15 NOTE — Telephone Encounter (Signed)
She was recommended to stop xanax

## 2012-05-15 NOTE — Progress Notes (Signed)
   THERAPIST PROGRESS NOTE  Session Time: Noon - 1 p.m.  Participation Level: Active  Behavioral Response: CasualAlertAnxious  Type of Therapy: Individual Therapy  Treatment Goals addressed: Coping  Interventions: Strength-based and Supportive  Summary: Judy Bailey is a 37 y.o. female who presents with PTSD, bipolar disorder, and ADHD.  She was referred by Dr. Lolly Mustache.  Patient reports she was arrested and was released from jail yesterday after six days.  She reports she has several felonies against her related to prescription abuse.  Patient states she has not done anything in a very long time to warrant these charges.  She states she believes this is a friend who continued abusing drugs who is using her name.  She has an attorney's appointment today to discuss same.  Patient also disclosed that she has been in a relationship with a man for 18 months and believes "for the first time she is in love."  However she is upset because he broke the relationship off with what is happening to her legally.  He is helping her financially however.  Patient also talked about being off of her Xanax and trying to figure out how to proceed - either by continuing to take it or stopping.  Suicidal/Homicidal: Nowithout intent/plan  Therapist Response:  Discussed what happened to patient legal history and 18 months ago patient was jailed for the same issue for over a month.  She reports she never wants to go back to jail.  Discussed her relationship with her boyfriend.  Discussed patient's history in that she reports "leaving that drug world behind and it is still in my life."  Talked about how even though one makes changes does not mean that the situation still does not need to be handled.  Reviewed treatment plan.  Explained to patient that AA/NA involved in her situation is mandatory to have therapy.  Will discuss in more detail next session.  Note:  There is a discrepancy with patient's medications.  Spoke  to Cristela Felt, RN who also spoke with Dr. Lolly Mustache (see notes from RN).  Plan: Return again in 1 weeks.  Diagnosis: Axis I: PTSD; Bipolar D/O, NOS; ADHD w/hyperactivity    Axis II: R/O BPD    Delayne Sanzo, LMFT, CTS 05/15/2012

## 2012-05-22 ENCOUNTER — Ambulatory Visit (INDEPENDENT_AMBULATORY_CARE_PROVIDER_SITE_OTHER): Payer: Medicaid Other | Admitting: Marriage and Family Therapist

## 2012-05-22 DIAGNOSIS — F909 Attention-deficit hyperactivity disorder, unspecified type: Secondary | ICD-10-CM

## 2012-05-22 DIAGNOSIS — F319 Bipolar disorder, unspecified: Secondary | ICD-10-CM

## 2012-05-22 DIAGNOSIS — F4310 Post-traumatic stress disorder, unspecified: Secondary | ICD-10-CM
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
## 2012-05-23 ENCOUNTER — Telehealth (HOSPITAL_COMMUNITY): Payer: Self-pay | Admitting: Marriage and Family Therapist

## 2012-05-23 NOTE — Telephone Encounter (Signed)
Spoke with patient relating to her court situation.  Patient states that she needs a letter recommending mental health court rather than regular court explaining why this is being recommended.  Will discuss with Dr. Lolly Mustache.

## 2012-05-24 ENCOUNTER — Ambulatory Visit (INDEPENDENT_AMBULATORY_CARE_PROVIDER_SITE_OTHER): Payer: Medicaid Other | Admitting: Psychiatry

## 2012-05-24 ENCOUNTER — Encounter (HOSPITAL_COMMUNITY): Payer: Self-pay | Admitting: Psychiatry

## 2012-05-24 VITALS — BP 136/78 | HR 80 | Wt 203.0 lb

## 2012-05-24 DIAGNOSIS — F909 Attention-deficit hyperactivity disorder, unspecified type: Secondary | ICD-10-CM

## 2012-05-24 DIAGNOSIS — F3162 Bipolar disorder, current episode mixed, moderate: Secondary | ICD-10-CM

## 2012-05-24 DIAGNOSIS — F319 Bipolar disorder, unspecified: Secondary | ICD-10-CM

## 2012-05-24 MED ORDER — HYDROXYZINE PAMOATE 25 MG PO CAPS: 25.0000 mg | ORAL_CAPSULE | Freq: Three times a day (TID) | ORAL | Status: DC | PRN

## 2012-05-24 MED ORDER — DIVALPROEX SODIUM 500 MG PO DR TAB: DELAYED_RELEASE_TABLET | ORAL | Status: DC

## 2012-05-24 MED ORDER — AMPHETAMINE-DEXTROAMPHET ER 30 MG PO CP24: 30.0000 mg | ORAL_CAPSULE | Freq: Every day | ORAL | Status: DC

## 2012-05-24 MED ORDER — ARIPIPRAZOLE 10 MG PO TABS: ORAL_TABLET | ORAL | Status: DC
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
## 2012-05-28 ENCOUNTER — Encounter: Payer: Self-pay | Admitting: Neurology

## 2012-05-28 NOTE — Telephone Encounter (Signed)
This encounter was created in error - please disregard.

## 2012-05-29 ENCOUNTER — Ambulatory Visit (INDEPENDENT_AMBULATORY_CARE_PROVIDER_SITE_OTHER): Payer: Medicaid Other | Admitting: Marriage and Family Therapist

## 2012-05-29 DIAGNOSIS — F909 Attention-deficit hyperactivity disorder, unspecified type: Secondary | ICD-10-CM

## 2012-05-29 DIAGNOSIS — F4310 Post-traumatic stress disorder, unspecified: Secondary | ICD-10-CM

## 2012-05-29 DIAGNOSIS — F319 Bipolar disorder, unspecified: Secondary | ICD-10-CM
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
## 2012-06-05 ENCOUNTER — Encounter: Payer: Self-pay | Admitting: Diagnostic Neuroimaging

## 2012-06-05 ENCOUNTER — Ambulatory Visit (INDEPENDENT_AMBULATORY_CARE_PROVIDER_SITE_OTHER): Payer: Medicaid Other | Admitting: Diagnostic Neuroimaging

## 2012-06-05 ENCOUNTER — Ambulatory Visit (INDEPENDENT_AMBULATORY_CARE_PROVIDER_SITE_OTHER): Payer: Medicaid Other | Admitting: Marriage and Family Therapist

## 2012-06-05 VITALS — BP 100/69 | HR 77 | Temp 98.1°F | Ht 69.0 in | Wt 205.0 lb

## 2012-06-05 DIAGNOSIS — F319 Bipolar disorder, unspecified: Secondary | ICD-10-CM

## 2012-06-05 DIAGNOSIS — G250 Essential tremor: Secondary | ICD-10-CM

## 2012-06-05 DIAGNOSIS — G252 Other specified forms of tremor: Secondary | ICD-10-CM

## 2012-06-05 DIAGNOSIS — F909 Attention-deficit hyperactivity disorder, unspecified type: Secondary | ICD-10-CM

## 2012-06-05 DIAGNOSIS — F4310 Post-traumatic stress disorder, unspecified: Secondary | ICD-10-CM

## 2012-06-05 DIAGNOSIS — R251 Tremor, unspecified: Secondary | ICD-10-CM | POA: Insufficient documentation

## 2012-06-05 DIAGNOSIS — G253 Myoclonus: Secondary | ICD-10-CM

## 2012-06-05 NOTE — Progress Notes (Signed)
[facility]  PATIENT: Judy Bailey DOB: Jun 16, 1975  REFERRING CLINICIAN: Zoe Lan, NP HISTORY FROM: patient REASON FOR VISIT: new consult   HISTORICAL  CHIEF COMPLAINT:  Chief Complaint  Patient presents with  . Tremors    arms and legs jerk    HISTORY OF PRESENT ILLNESS:   37 year old right-handed female with history of bipolar disorder, PTSD, ADHD, panic disorder, anxiety, here for evaluation of tremor and muscle jerks for past one and half years.  Patient describes a gradual onset, postural and action tremor of bilateral upper extremities. She describes a fine jittery movement. This affects her movements such as eating, drinking, holding a book. Sometimes she notices tremor at rest.  Also patient has noticed intermittent muscle jerks of her upper or lower extremities, randomly. Symptoms were occurring once per day until one and half months ago when she was started on Depakote and Vistaril for her mood stabilization. Ever since this time her intermittent muscle jerks have significantly reduced. Her tremor is stable.  Patient has family history for Parkinson's disease in her father and paternal grandfather. [name] father also was diagnosed with prion disease (CJD), and deceased in 06-10-2008.  REVIEW OF SYSTEMS: Full 14 system review of systems performed and notable only for fevers and chills weight gain fatigue palpitation pain is blurred vision shortness of breath soreness blood in stool increased thirst cramps achy muscles anxiety nicely decreased energy racing thoughts dizziness slurred speech weakness numbness confusion memory loss insomnia sleepiness snoring restless legs and arms.  ALLERGIES: No Known Allergies  HOME MEDICATIONS: Outpatient Prescriptions Prior to Visit  Medication Sig Dispense Refill  . amphetamine-dextroamphetamine (ADDERALL XR) 30 MG 24 hr capsule Take 1 capsule (30 mg total) by mouth daily.  30 capsule  0  . ARIPiprazole  (ABILIFY) 10 MG tablet Take 1/2 in am and 1 tab at bed time  45 tablet  0  . atorvastatin (LIPITOR) 40 MG tablet Take 40 mg by mouth daily.      Marland Kitchen desvenlafaxine (PRISTIQ) 100 MG 24 hr tablet Take 1 tablet (100 mg total) by mouth daily.  30 tablet  0  . divalproex (DEPAKOTE) 500 MG DR tablet Take 2 tab at time  60 tablet  0  . hydrOXYzine (VISTARIL) 25 MG capsule Take 1 capsule (25 mg total) by mouth 3 (three) times daily as needed for anxiety.  60 capsule  0  . omeprazole (PRILOSEC) 20 MG capsule Take 20 mg by mouth daily.      . propranolol ER (INDERAL LA) 120 MG 24 hr capsule Take 120 mg by mouth daily.      Marland Kitchen glycopyrrolate (ROBINUL) 2 MG tablet Take 1 tablet (2 mg total) by mouth 2 (two) times daily.  180 tablet  3  . hyoscyamine (LEVBID) 0.375 MG 12 hr tablet Take 1 tablet (0.375 mg total) by mouth 2 (two) times daily.  60 tablet  3   No facility-administered medications prior to visit.    PAST MEDICAL HISTORY: Past Medical History  Diagnosis Date  . Other and unspecified hyperlipidemia   . Unspecified essential hypertension   . Unspecified sleep apnea   . Backache, unspecified   . Other nonspecific finding on examination of urine   . Leukorrhea, not specified as infective   . Other symptoms involving digestive system   . Absence of menstruation   . Body mass index 30.0-30.9, adult   . Tobacco use disorder   . Palpitations   . Dyspepsia and other specified  disorders of function of stomach   . Trichomonal vulvovaginitis   . Restless legs syndrome (RLS)   . Insomnia, unspecified   . Depressive disorder, not elsewhere classified   . Attention deficit disorder without mention of hyperactivity   . Agoraphobia with panic disorder   . Disturbance of skin sensation   . Ovarian cancer   . Anxiety   . Bipolar disorder   . Night terrors   . PTSD (post-traumatic stress disorder)   . Borderline personality disorder     PAST SURGICAL HISTORY: Past Surgical History  Procedure  Laterality Date  . Tonsillectomy    . Tympanic membrane  2009  . Knee cartilage surgery      right    FAMILY HISTORY: Family History  Problem Relation Age of Onset  . Prostate cancer Maternal Grandfather   . Liver disease Maternal Grandfather   . Breast cancer Paternal Grandmother   . Diabetes Paternal Grandmother   . Bipolar disorder Paternal Grandmother   . Alzheimer's disease Paternal Grandfather   . Parkinson's disease Paternal Grandfather   . Hypertension Brother   . Hypertension Father   . Diabetes Father   . Heart disease Father   . Depression Father   . Colon polyps Father   . Other Father     mad cow disease  . Bipolar disorder Sister   . Colon polyps Brother   . Colon polyps Mother   . Kidney disease Mother   . Clotting disorder Maternal Grandmother   . Irritable bowel syndrome Sister   . Irritable bowel syndrome Paternal Aunt   . Bipolar disorder Paternal Aunt   . Bipolar disorder Cousin     SOCIAL HISTORY:  History   Social History  . Marital Status: Divorced    Spouse Name: N/A    Number of Children: 1  . 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 Smoking status: Current Every Day Smoker -- 1.00 packs/day for 22 years    Types: Cigarettes  . Smokeless tobacco: Never Used  . Alcohol Use: No  . Drug Use: No  . Sexually Active: Not on file   Other Topics Concern  . Not on file   Social History Narrative   Pt lives at home with mother and son.   Caffeine Use: 3 sodas daily     PHYSICAL EXAM  Filed Vitals:   06/05/12 1113  BP: 100/69  Pulse: 77  Temp: 98.1 F (36.7 C)  TempSrc: Oral  Height: 5\' 9"  (1.753 m)  Weight: 205 lb (92.987 kg)   Body mass index is 30.26 kg/(m^2).  GENERAL EXAM: Patient is in no distress  CARDIOVASCULAR: Regular rate and rhythm, no murmurs, no carotid bruits  NEUROLOGIC: MENTAL STATUS: awake, alert, language fluent, comprehension intact, naming  intact CRANIAL NERVE: no papilledema on fundoscopic exam, pupils equal and reactive to light, visual fields full to confrontation, extraocular muscles intact, no nystagmus, facial sensation and strength symmetric, uvula midline, shoulder shrug symmetric, tongue midline. MOTOR: FINE POSTURAL TREMOR OF BUE. NO BRADYKINESIA OR RIGIDITY. Normal bulk and tone, full strength in the BUE, BLE SENSORY: normal and symmetric to light touch, pinprick, temperature, vibration. COORDINATION: finger-nose-finger, fine finger movements normal REFLEXES: deep tendon reflexes present and symmetric GAIT/STATION: narrow based gait; able to walk on toes, heels and tandem; romberg is negative   DIAGNOSTIC DATA (LABS, IMAGING, TESTING) - I reviewed patient records, labs, notes, testing and imaging  myself where available.  Lab Results  Component Value Date   WBC 7.9 08/18/2010   HGB 14.4 08/18/2010   HCT 40.8 08/18/2010   MCV 94.7 08/18/2010   PLT 260 08/18/2010      Component Value Date/Time   NA 139 08/18/2010 2102   K 3.6 08/18/2010 2102   CL 103 08/18/2010 2102   CO2 27 08/18/2010 2102   GLUCOSE 121* 08/18/2010 2102   BUN 8 08/18/2010 2102   CREATININE 1.06 08/18/2010 2102   CALCIUM 9.4 08/18/2010 2102   PROT 7.2 08/18/2010 2102   ALBUMIN 3.9 08/18/2010 2102   AST 14 08/18/2010 2102   ALT 16 08/18/2010 2102   ALKPHOS 79 08/18/2010 2102   BILITOT 0.3 08/18/2010 2102   GFRNONAA 58* 08/18/2010 2102   GFRAA >60 08/18/2010 2102   No results found for this basename: CHOL, HDL, LDLCALC, LDLDIRECT, TRIG, CHOLHDL   No results found for this basename: HGBA1C   No results found for this basename: VITAMINB12   No results found for this basename: TSH     ASSESSMENT AND PLAN  37 y.o. year old female  has a past medical history of Other and unspecified hyperlipidemia; Unspecified essential hypertension; Unspecified sleep apnea; Backache, unspecified; Other nonspecific finding on examination of urine; Leukorrhea, not  specified as infective; Other symptoms involving digestive system; Absence of menstruation; Body mass index 30.0-30.9, adult; Tobacco use disorder; Palpitations; Dyspepsia and other specified disorders of function of stomach; Trichomonal vulvovaginitis; Restless legs syndrome (RLS); Insomnia, unspecified; Depressive disorder, not elsewhere classified; Attention deficit disorder without mention of hyperactivity; Agoraphobia with panic disorder; Disturbance of skin sensation; Ovarian cancer; Anxiety; Bipolar disorder; Night terrors; PTSD (post-traumatic stress disorder); and Borderline personality disorder. here with postural tremor and intermittent myoclonus.  Ddx: essential tremor, enhanced physiologic tremor, medication side effect, myoclonic epilepsy   PLAN: 1. Check MRI and EEG  Orders Placed This Encounter  Procedures  . MR Brain Wo Contrast  . EEG adult     Suanne Marker, MD 06/05/2012, 4:32 PM Certified in Neurology, Neurophysiology and Neuroimaging  [facility] [address], [address] [place], [place] [address] [phone]
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
## 2012-06-05 NOTE — Patient Instructions (Signed)
I will order addl testing.

## 2012-06-10 ENCOUNTER — Encounter (HOSPITAL_COMMUNITY): Payer: Self-pay | Admitting: Psychiatry

## 2012-06-10 ENCOUNTER — Ambulatory Visit (INDEPENDENT_AMBULATORY_CARE_PROVIDER_SITE_OTHER): Payer: Federal, State, Local not specified - Other | Admitting: Psychiatry

## 2012-06-10 VITALS — BP 130/87 | HR 73 | Ht 69.0 in | Wt 205.8 lb

## 2012-06-10 DIAGNOSIS — F319 Bipolar disorder, unspecified: Secondary | ICD-10-CM

## 2012-06-10 DIAGNOSIS — F3162 Bipolar disorder, current episode mixed, moderate: Secondary | ICD-10-CM

## 2012-06-10 MED ORDER — HYDROXYZINE PAMOATE 50 MG PO CAPS: 50.0000 mg | ORAL_CAPSULE | Freq: Three times a day (TID) | ORAL | Status: DC | PRN

## 2012-06-10 MED ORDER — CLONAZEPAM 1 MG PO TABS: ORAL_TABLET | ORAL | Status: DC
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
## 2012-06-24 ENCOUNTER — Ambulatory Visit (HOSPITAL_COMMUNITY): Payer: Medicaid Other | Admitting: Psychiatry

## 2012-06-25 ENCOUNTER — Ambulatory Visit (HOSPITAL_COMMUNITY): Payer: Self-pay | Admitting: Marriage and Family Therapist

## 2012-06-27 ENCOUNTER — Encounter (HOSPITAL_COMMUNITY): Payer: Self-pay

## 2012-07-01 ENCOUNTER — Ambulatory Visit
Admission: RE | Admit: 2012-07-01 | Discharge: 2012-07-01 | Disposition: A | Discharge status: Home / Self Care | Payer: Medicaid Other | Source: Ambulatory Visit | Attending: Diagnostic Neuroimaging | Admitting: Diagnostic Neuroimaging

## 2012-07-01 ENCOUNTER — Other Ambulatory Visit (HOSPITAL_COMMUNITY): Payer: Self-pay | Admitting: Psychiatry

## 2012-07-01 DIAGNOSIS — G253 Myoclonus: Secondary | ICD-10-CM

## 2012-07-01 DIAGNOSIS — G252 Other specified forms of tremor: Secondary | ICD-10-CM

## 2012-07-01 DIAGNOSIS — R259 Unspecified abnormal involuntary movements: Secondary | ICD-10-CM

## 2012-07-02 ENCOUNTER — Telehealth (HOSPITAL_COMMUNITY): Payer: Self-pay

## 2012-07-02 ENCOUNTER — Ambulatory Visit (INDEPENDENT_AMBULATORY_CARE_PROVIDER_SITE_OTHER): Payer: Medicaid Other | Admitting: Marriage and Family Therapist

## 2012-07-02 DIAGNOSIS — F909 Attention-deficit hyperactivity disorder, unspecified type: Secondary | ICD-10-CM

## 2012-07-02 DIAGNOSIS — F319 Bipolar disorder, unspecified: Secondary | ICD-10-CM

## 2012-07-02 DIAGNOSIS — F4310 Post-traumatic stress disorder, unspecified: Secondary | ICD-10-CM

## 2012-07-02 NOTE — Progress Notes (Signed)
   THERAPIST PROGRESS NOTE  Session Time:  11:00 - Noon  Participation Level: Active  Behavioral Response: CasualAlertAnxiousAfraid  Type of Therapy: Individual Therapy  Treatment Goals addressed: Coping  Interventions: Strength-based, Assertiveness Training and Supportive  Summary: Judy Bailey is a 37 y.o. female who presents with PTSD; bipolar d/o, and ADHD.  She was referred by Dr. Lolly Mustache.  Patient reports she has been feeling afraid because she may have to go to jail.  She reports meeting with her probation officer before this session and found out that due to the new felonies he will have to reports that her probation has been violated.  She states she has a court date in [place] on 07/05/12 and one in [place] of 07/23/12.  She says she is having dreams of feeing helpless over this situation.  Patient did say her mother and son have been supportive and her mother will take care of her son if she does go to jail.  She admits in the past her mother has not been supportive of her when there are problems, and still sees the mother showing favoritism towards her sister.  Patient did say she was not suicidal and did not feel like abusing any medications because of the situation.  She reported, "I just want to get this over with and get on with my life."  She admits reading the book, "Getting in Touch with Your Tracie Harrier" which is helping her to cope with current legal problems and saying no to others.  She reports it feels good not to be so accommodating to others.   Suicidal/Homicidal: Nowithout intent/plan  Therapist Response:  Assessed for suicide and patient is not suicidal at this time.  Plan: Return again in 1 weeks.  Discussed patient beginning to feel comfortable saying no to others.  She reports also teaching her son this idea as well as she develops her own ability to say no.  Patient states she feels good when she says no.  Discussed upcoming legal problems including  patient stating she was turned down for mandated treatment through the courts.  Discussed patient's fear of going to jail.  Patient said she forgot to give additional information to our Scholarship Department to get into the Psych IOP program but will follow through since this writer states this is still recommended.  Patient states she has not gone to an AA or NA meeting.  Told patient this is a requirement for therapy and patient agreed to go.  Diagnosis: Axis I: PTSD; Bipolar d/o NOS; ADHD w/hyperactivity    Axis II: Deferred    Amayrani Bennick, LMFT, CTS 07/02/2012

## 2012-07-09 ENCOUNTER — Ambulatory Visit (HOSPITAL_COMMUNITY): Payer: Self-pay | Admitting: Marriage and Family Therapist

## 2012-07-09 NOTE — Progress Notes (Unsigned)
   THERAPIST PROGRESS NOTE  Session Time:  11:00 - Noon  Participation Level: {BHH PARTICIPATION ZOXWR:60454}  Behavioral Response: {Appearance:22683}{BHH LEVEL OF CONSCIOUSNESS:22305}{BHH MOOD:22306}  Type of Therapy: Individual Therapy  Treatment Goals addressed: Coping  Interventions: {CHL AMB BH Type of Intervention:21022753}  Summary: Judy Bailey is a 37 y.o. female who presents with PTSD; bipolar disorder; ADHD.  She was referred by Dr. Lolly Mustache.   Suicidal/Homicidal: {BHH YES OR NO:22294}{yes/no/with/without intent/plan:22693}  Therapist Response: ***  Plan: Return again in 1 weeks.  Diagnosis: Axis I: PTSD; Bipolar disorder NOS; ADHD w/hyperactivity    Axis II: Deferred    Merrie Epler, LMFT, CTS 07/09/2012

## 2012-07-10 ENCOUNTER — Other Ambulatory Visit (HOSPITAL_COMMUNITY): Payer: Self-pay | Admitting: *Deleted

## 2012-07-10 DIAGNOSIS — F909 Attention-deficit hyperactivity disorder, unspecified type: Secondary | ICD-10-CM

## 2012-07-10 MED ORDER — AMPHETAMINE-DEXTROAMPHET ER 30 MG PO CP24: 30.0000 mg | ORAL_CAPSULE | Freq: Every day | ORAL | Status: DC

## 2012-07-15 ENCOUNTER — Ambulatory Visit (INDEPENDENT_AMBULATORY_CARE_PROVIDER_SITE_OTHER): Payer: Federal, State, Local not specified - Other | Admitting: Psychiatry

## 2012-07-15 ENCOUNTER — Telehealth (HOSPITAL_COMMUNITY): Payer: Self-pay

## 2012-07-15 ENCOUNTER — Encounter (HOSPITAL_COMMUNITY): Payer: Self-pay | Admitting: Psychiatry

## 2012-07-15 ENCOUNTER — Ambulatory Visit (HOSPITAL_COMMUNITY): Payer: Self-pay | Admitting: Psychiatry

## 2012-07-15 VITALS — BP 110/78 | HR 92 | Ht 69.0 in | Wt 211.0 lb

## 2012-07-15 DIAGNOSIS — F909 Attention-deficit hyperactivity disorder, unspecified type: Secondary | ICD-10-CM

## 2012-07-15 DIAGNOSIS — F3162 Bipolar disorder, current episode mixed, moderate: Secondary | ICD-10-CM

## 2012-07-15 DIAGNOSIS — F319 Bipolar disorder, unspecified: Secondary | ICD-10-CM

## 2012-07-15 MED ORDER — ARIPIPRAZOLE 10 MG PO TABS: ORAL_TABLET | ORAL | Status: DC

## 2012-07-15 MED ORDER — CLONAZEPAM 1 MG PO TABS: ORAL_TABLET | ORAL | Status: DC

## 2012-07-15 MED ORDER — AMPHETAMINE-DEXTROAMPHET ER 30 MG PO CP24: 30.0000 mg | ORAL_CAPSULE | Freq: Every day | ORAL | Status: DC

## 2012-07-15 MED ORDER — HYDROXYZINE PAMOATE 50 MG PO CAPS: 50.0000 mg | ORAL_CAPSULE | Freq: Three times a day (TID) | ORAL | Status: DC | PRN

## 2012-07-15 MED ORDER — DESVENLAFAXINE SUCCINATE ER 100 MG PO TB24: 100.0000 mg | ORAL_TABLET | Freq: Every day | ORAL | Status: DC
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
## 2012-07-15 NOTE — Telephone Encounter (Signed)
07/15/12 PT PICK-UP MEDICATION REFILL DURING HER APPT WITH PROVIDER.Marland KitchenMarguerite Olea

## 2012-07-16 ENCOUNTER — Ambulatory Visit (HOSPITAL_COMMUNITY): Payer: Self-pay | Admitting: Marriage and Family Therapist

## 2012-07-16 NOTE — Progress Notes (Unsigned)
   THERAPIST PROGRESS NOTE  Session Time:  11:00 - Noon  Participation Level: Did Not Attend  Behavioral Response:   Type of Therapy: Individual Therapy  Treatment Goals addressed: Coping  Interventions:   Summary: Judy Bailey is a 37 y.o. female who presents with PTSD; bipolar disorder, and ADHD.  She was referred by Dr. Lolly Mustache.  PATIENT DID NOT SHOW FOR HER APPOINTMENT FOR A SECOND WEEK IN A ROW.  Suicidal/Homicidal:   Therapist Response: SPOKE TO DR. ARFEEN WHO DID SEE PATIENT YESTERDAY FOR MEDICATION MANAGEMENT.  WILL SEND PATIENT LETTER RE MISSING TWO APPOINTMENTS IN A ROW.  Plan: Return again in 1 weeks.  Diagnosis: Axis I: PTSD; Bipolar d/o NOS; ADHD w/hyperactivity    Axis II: Deferred    Kimball Appleby, LMFT, CTS 07/16/2012

## 2012-07-18 ENCOUNTER — Encounter (HOSPITAL_COMMUNITY): Payer: Self-pay

## 2012-07-23 ENCOUNTER — Ambulatory Visit (HOSPITAL_COMMUNITY): Payer: Self-pay | Admitting: Marriage and Family Therapist

## 2012-07-30 ENCOUNTER — Ambulatory Visit (HOSPITAL_COMMUNITY): Payer: Self-pay | Admitting: Marriage and Family Therapist

## 2012-08-06 ENCOUNTER — Ambulatory Visit (HOSPITAL_COMMUNITY): Payer: Self-pay | Admitting: Marriage and Family Therapist

## 2012-08-20 ENCOUNTER — Ambulatory Visit (HOSPITAL_COMMUNITY): Payer: Self-pay | Admitting: Marriage and Family Therapist

## 2012-08-26 ENCOUNTER — Ambulatory Visit (HOSPITAL_COMMUNITY): Payer: Self-pay | Admitting: Psychiatry

## 2012-08-27 ENCOUNTER — Ambulatory Visit (HOSPITAL_COMMUNITY): Payer: Self-pay | Admitting: Marriage and Family Therapist

## 2012-09-03 ENCOUNTER — Ambulatory Visit (INDEPENDENT_AMBULATORY_CARE_PROVIDER_SITE_OTHER): Payer: Federal, State, Local not specified - Other | Admitting: Psychiatry

## 2012-09-03 ENCOUNTER — Ambulatory Visit (HOSPITAL_COMMUNITY): Payer: Self-pay | Admitting: Marriage and Family Therapist

## 2012-09-03 ENCOUNTER — Encounter (HOSPITAL_COMMUNITY): Payer: Self-pay | Admitting: Psychiatry

## 2012-09-03 VITALS — BP 122/80 | HR 68 | Resp 12 | Ht 69.0 in | Wt 215.6 lb

## 2012-09-03 DIAGNOSIS — F319 Bipolar disorder, unspecified: Secondary | ICD-10-CM

## 2012-09-03 DIAGNOSIS — F3162 Bipolar disorder, current episode mixed, moderate: Secondary | ICD-10-CM

## 2012-09-03 MED ORDER — DIVALPROEX SODIUM 500 MG PO DR TAB: DELAYED_RELEASE_TABLET | ORAL | Status: DC

## 2012-09-03 MED ORDER — HYDROXYZINE PAMOATE 50 MG PO CAPS: 50.0000 mg | ORAL_CAPSULE | Freq: Three times a day (TID) | ORAL | Status: DC | PRN

## 2012-09-03 MED ORDER — DESVENLAFAXINE SUCCINATE ER 100 MG PO TB24: 100.0000 mg | ORAL_TABLET | Freq: Every day | ORAL | Status: AC

## 2012-09-03 MED ORDER — ARIPIPRAZOLE 10 MG PO TABS: ORAL_TABLET | ORAL | Status: DC
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
## 2012-09-10 ENCOUNTER — Ambulatory Visit (HOSPITAL_COMMUNITY): Payer: Self-pay | Admitting: Marriage and Family Therapist

## 2012-09-17 ENCOUNTER — Ambulatory Visit (HOSPITAL_COMMUNITY): Payer: Self-pay | Admitting: Marriage and Family Therapist

## 2012-09-25 DIAGNOSIS — F4001 Agoraphobia with panic disorder: Secondary | ICD-10-CM | POA: Insufficient documentation

## 2012-09-25 DIAGNOSIS — F988 Other specified behavioral and emotional disorders with onset usually occurring in childhood and adolescence: Secondary | ICD-10-CM | POA: Insufficient documentation

## 2012-10-08 ENCOUNTER — Telehealth: Payer: Self-pay | Admitting: Diagnostic Neuroimaging

## 2012-10-08 NOTE — Telephone Encounter (Signed)
Spoke to patient. Gave MRI result. She says she has been having HA(s). Requesting to know if EEG should be done.

## 2012-10-25 NOTE — Telephone Encounter (Signed)
Nothing at this time. -VRP

## 2013-02-18 DIAGNOSIS — I10 Essential (primary) hypertension: Secondary | ICD-10-CM | POA: Insufficient documentation

## 2013-02-18 DIAGNOSIS — E669 Obesity, unspecified: Secondary | ICD-10-CM | POA: Insufficient documentation

## 2013-02-20 DIAGNOSIS — R87612 Low grade squamous intraepithelial lesion on cytologic smear of cervix (LGSIL): Secondary | ICD-10-CM | POA: Insufficient documentation

## 2013-02-20 DIAGNOSIS — R7309 Other abnormal glucose: Secondary | ICD-10-CM | POA: Insufficient documentation

## 2013-02-20 DIAGNOSIS — IMO0002 Reserved for concepts with insufficient information to code with codable children: Secondary | ICD-10-CM | POA: Insufficient documentation

## 2013-02-25 ENCOUNTER — Encounter: Payer: Self-pay | Admitting: Obstetrics

## 2013-02-27 ENCOUNTER — Other Ambulatory Visit: Payer: Self-pay

## 2013-02-27 DIAGNOSIS — Z803 Family history of malignant neoplasm of breast: Secondary | ICD-10-CM

## 2013-02-27 DIAGNOSIS — Z1231 Encounter for screening mammogram for malignant neoplasm of breast: Secondary | ICD-10-CM

## 2013-02-28 ENCOUNTER — Ambulatory Visit
Admission: RE | Admit: 2013-02-28 | Discharge: 2013-02-28 | Disposition: A | Discharge status: Home / Self Care | Payer: Medicaid Other | Source: Ambulatory Visit

## 2013-02-28 DIAGNOSIS — Z803 Family history of malignant neoplasm of breast: Secondary | ICD-10-CM

## 2013-02-28 DIAGNOSIS — Z1231 Encounter for screening mammogram for malignant neoplasm of breast: Secondary | ICD-10-CM

## 2013-03-10 ENCOUNTER — Ambulatory Visit (INDEPENDENT_AMBULATORY_CARE_PROVIDER_SITE_OTHER): Payer: Medicaid Other | Admitting: Obstetrics

## 2013-03-10 ENCOUNTER — Ambulatory Visit: Payer: Medicaid Other | Admitting: Obstetrics

## 2013-03-10 DIAGNOSIS — R87612 Low grade squamous intraepithelial lesion on cytologic smear of cervix (LGSIL): Secondary | ICD-10-CM | POA: Insufficient documentation

## 2013-03-10 NOTE — Progress Notes (Signed)
Subjective:     Judy Bailey is a 38 y.o. female here for a routine exam.  Current complaints: Pt is in office today for follow up from abnormal pap.  LGSIL pap.  Pt was advised by [facility], Eldridge Abrahams,  to f/u with ob/gyn..  Personal health questionnaire reviewed: yes.   Gynecologic History No LMP recorded. Patient has had an injection. Contraception: none Last Pap: 2015. Results were: abnormal Last mammogram: 2015. Results were: normal, with dense tissue  Obstetric History OB History  No data available     The following portions of the patient's history were reviewed and updated as appropriate: allergies, current medications, past family history, past medical history, past social history, past surgical history and problem list.  Review of Systems Pertinent items are noted in HPI.    Objective:    No exam performed today, patient left without being seen.    Assessment:    LGSIL.   Plan:    Colposcopy.

## 2013-03-13 ENCOUNTER — Ambulatory Visit (INDEPENDENT_AMBULATORY_CARE_PROVIDER_SITE_OTHER): Payer: Medicaid Other | Admitting: Obstetrics

## 2013-03-13 ENCOUNTER — Encounter: Payer: Self-pay | Admitting: Obstetrics

## 2013-03-13 VITALS — BP 110/76 | HR 75 | Temp 98.0°F | Ht 69.0 in | Wt 211.0 lb

## 2013-03-13 DIAGNOSIS — R87612 Low grade squamous intraepithelial lesion on cytologic smear of cervix (LGSIL): Secondary | ICD-10-CM

## 2013-03-13 NOTE — Progress Notes (Signed)
Subjective:     Judy Bailey is a 38 y.o. female here for a routine exam.  Current complaints: consult for colposcopy.  Personal health questionnaire reviewed: yes.   Gynecologic History [name] last menstrual period was 02/25/2013. Contraception: abstinence Last Pap: 02/2013. Results were: abnormal LGSIL Last mammogram: 2015. Results were: normal  Obstetric History OB History  Gravida Para Term Preterm AB SAB TAB Ectopic Multiple Living  1 1 1       1     # Outcome Date GA Lbr Len/2nd Weight Sex Delivery Anes PTL Lv  1 TRM 10/01/96 [redacted]w[redacted]d  8 lb 9 oz (3.884 kg) M SVD EPI  Y       The following portions of the patient's history were reviewed and updated as appropriate: allergies, current medications, past family history, past medical history, past social history, past surgical history and problem list.  Review of Systems Pertinent items are noted in HPI.    Objective:    No exam performed today, Consult only.    Assessment:    LGSIL   Plan:    Education reviewed: safe sex/STD prevention and management of abnormal pap smears. Contraception: none. Follow up in: 2 weeks.    Colposcopy.

## 2013-03-14 ENCOUNTER — Encounter: Payer: Self-pay | Admitting: Obstetrics

## 2013-03-19 ENCOUNTER — Encounter: Payer: Self-pay | Admitting: Obstetrics

## 2013-03-19 ENCOUNTER — Ambulatory Visit (INDEPENDENT_AMBULATORY_CARE_PROVIDER_SITE_OTHER): Payer: Medicaid Other | Admitting: Obstetrics

## 2013-03-19 VITALS — BP 118/73 | HR 77 | Temp 98.5°F | Wt 209.0 lb

## 2013-03-19 DIAGNOSIS — R87612 Low grade squamous intraepithelial lesion on cytologic smear of cervix (LGSIL): Secondary | ICD-10-CM

## 2013-03-19 NOTE — Progress Notes (Signed)
Patient here for colposcopy for LGSIL.  She admits to recent unprotected sex and is at risk for pregnancy. Colposcopy rescheduled.

## 2013-03-20 ENCOUNTER — Ambulatory Visit: Payer: Medicaid Other | Admitting: Obstetrics

## 2013-03-27 ENCOUNTER — Encounter: Payer: Medicaid Other | Admitting: Obstetrics

## 2013-04-02 ENCOUNTER — Ambulatory Visit: Payer: Medicaid Other | Admitting: Obstetrics

## 2013-04-09 ENCOUNTER — Encounter: Payer: Self-pay | Admitting: Obstetrics

## 2013-04-09 ENCOUNTER — Ambulatory Visit (INDEPENDENT_AMBULATORY_CARE_PROVIDER_SITE_OTHER): Payer: Medicaid Other | Admitting: Obstetrics

## 2013-04-09 ENCOUNTER — Other Ambulatory Visit: Payer: Self-pay | Admitting: Obstetrics

## 2013-04-09 VITALS — BP 118/83 | HR 88 | Temp 98.8°F | Ht 68.0 in | Wt 210.0 lb

## 2013-04-09 DIAGNOSIS — B3731 Acute candidiasis of vulva and vagina: Secondary | ICD-10-CM

## 2013-04-09 DIAGNOSIS — Z3202 Encounter for pregnancy test, result negative: Secondary | ICD-10-CM

## 2013-04-09 DIAGNOSIS — N760 Acute vaginitis: Secondary | ICD-10-CM

## 2013-04-09 DIAGNOSIS — B373 Candidiasis of vulva and vagina: Secondary | ICD-10-CM

## 2013-04-09 DIAGNOSIS — Z01812 Encounter for preprocedural laboratory examination: Secondary | ICD-10-CM

## 2013-04-09 DIAGNOSIS — R52 Pain, unspecified: Secondary | ICD-10-CM

## 2013-04-09 DIAGNOSIS — L0232 Furuncle of buttock: Secondary | ICD-10-CM | POA: Insufficient documentation

## 2013-04-09 DIAGNOSIS — L0233 Carbuncle of buttock: Secondary | ICD-10-CM

## 2013-04-09 DIAGNOSIS — R87612 Low grade squamous intraepithelial lesion on cytologic smear of cervix (LGSIL): Secondary | ICD-10-CM

## 2013-04-09 LAB — POCT URINE PREGNANCY: PREG TEST UR: NEGATIVE

## 2013-04-09 MED ORDER — CLINDAMYCIN HCL 300 MG PO CAPS: 300.0000 mg | ORAL_CAPSULE | Freq: Three times a day (TID) | ORAL | Status: DC

## 2013-04-09 MED ORDER — IBUPROFEN 800 MG PO TABS: 800.0000 mg | ORAL_TABLET | Freq: Three times a day (TID) | ORAL | Status: DC | PRN

## 2013-04-09 MED ORDER — FLUCONAZOLE 150 MG PO TABS: 150.0000 mg | ORAL_TABLET | Freq: Once | ORAL | Status: DC

## 2013-04-09 NOTE — Progress Notes (Signed)
Colposcopy Procedure Note  Indications: Pap smear N/A months ago showed: LGSIL From outside [place] . The prior pap showed N/A.  Prior cervical/vaginal disease: normal exam without visible pathology. Prior cervical treatment: no treatment. UPT: Negative  Procedure Details  The risks and benefits of the procedure and Written informed consent obtained.  A time-out was performed confirming the patient, procedure and allergy status  Speculum placed in vagina and excellent visualization of cervix achieved, cervix swabbed x 3 with acetic acid solution.  Findings: Cervix: no visible lesions; SCJ visualized 360 degrees without lesions, endocervical curettage performed, cervical biopsies taken at 6 and 12 o'clock, specimen labelled and sent to pathology and hemostasis achieved with silver nitrate.   Vaginal inspection: normal without visible lesions. Vulvar colposcopy: vulvar colposcopy not performed.    Specimens: ECC and Cervical Biopsies  Complications: none.  Plan: Specimens labelled and sent to Pathology. Will base further treatment on Pathology findings. Post biopsy instructions given to patient. Return to discuss Pathology results in 2 weeks.

## 2013-04-10 ENCOUNTER — Encounter: Payer: Self-pay | Admitting: Obstetrics

## 2013-04-10 LAB — WET PREP BY MOLECULAR PROBE
CANDIDA SPECIES: NEGATIVE
Gardnerella vaginalis: POSITIVE — AB
TRICHOMONAS VAG: NEGATIVE

## 2013-04-24 ENCOUNTER — Ambulatory Visit (INDEPENDENT_AMBULATORY_CARE_PROVIDER_SITE_OTHER): Payer: Medicaid Other | Admitting: Obstetrics

## 2013-04-24 DIAGNOSIS — B9689 Other specified bacterial agents as the cause of diseases classified elsewhere: Secondary | ICD-10-CM

## 2013-04-24 DIAGNOSIS — Z0000 Encounter for general adult medical examination without abnormal findings: Secondary | ICD-10-CM

## 2013-04-24 DIAGNOSIS — A499 Bacterial infection, unspecified: Secondary | ICD-10-CM

## 2013-04-24 DIAGNOSIS — N760 Acute vaginitis: Secondary | ICD-10-CM

## 2013-04-24 DIAGNOSIS — N870 Mild cervical dysplasia: Secondary | ICD-10-CM

## 2013-04-24 MED ORDER — METRONIDAZOLE 500 MG PO TABS: 500.0000 mg | ORAL_TABLET | Freq: Two times a day (BID) | ORAL | Status: DC

## 2013-04-24 MED ORDER — CITRANATAL HARMONY 27-1-260 MG PO CAPS: 1.0000 | ORAL_CAPSULE | Freq: Every day | ORAL | Status: DC

## 2013-04-24 NOTE — Progress Notes (Signed)
Subjective:     Judy Bailey is a 38 y.o. female here for a routine exam.  Current complaints: Patient is in the office to view her colpo results. Patient reports she bled for several days after her procedure- but otherwise has done well.   Personal health questionnaire:  Is patient Ashkenazi Jewish, have a family history of breast and/or ovarian cancer: no Is there a family history of uterine cancer diagnosed at age < 63, gastrointestinal cancer, urinary tract cancer, family member who is a Field seismologist syndrome-associated carrier: no Is the patient overweight and hypertensive, family history of diabetes, personal history of gestational diabetes or PCOS: no Is patient over 56, have PCOS,  family history of premature CHD under age 57, diabetes, smoke, have hypertension or peripheral artery disease:  no  The HPI was reviewed and explored in further detail by the provider. Gynecologic History [name] last menstrual period was 03/24/2013. Contraception: none Last Pap: current. Results were: abnormal- Colpo Last mammogram: 2015. Results were: normal  Obstetric History OB History  Gravida Para Term Preterm AB SAB TAB Ectopic Multiple Living  1 1 1       1     # Outcome Date GA Lbr Len/2nd Weight Sex Delivery Anes PTL Lv  1 TRM 10/01/96 [redacted]w[redacted]d  8 lb 9 oz (3.884 kg) M SVD EPI  Y       The following portions of the patient's history were reviewed and updated as appropriate: allergies, current medications, past family history, past medical history, past social history, past surgical history and problem list.  Review of Systems Pertinent items are noted in HPI.    Objective:    No exam performed today, Consult only.    100% of 10 min visit spent on counseling and coordination of care.   Assessment:    LGSIL   Plan:    Education reviewed: Management of LGSIL. Follow up in: 6 months.  Repeat Pap q 6 Months per Dysplasia Protocol.

## 2013-04-28 ENCOUNTER — Encounter: Payer: Self-pay | Admitting: Obstetrics

## 2013-08-04 ENCOUNTER — Encounter: Payer: Self-pay | Admitting: Obstetrics & Gynecology

## 2013-08-04 ENCOUNTER — Ambulatory Visit (INDEPENDENT_AMBULATORY_CARE_PROVIDER_SITE_OTHER): Payer: Medicaid Other | Admitting: Obstetrics & Gynecology

## 2013-08-04 VITALS — BP 134/85 | HR 83 | Wt 194.0 lb

## 2013-08-04 DIAGNOSIS — B372 Candidiasis of skin and nail: Secondary | ICD-10-CM

## 2013-08-04 DIAGNOSIS — N3090 Cystitis, unspecified without hematuria: Secondary | ICD-10-CM

## 2013-08-04 LAB — POCT URINALYSIS DIPSTICK
Bilirubin, UA: NEGATIVE
Glucose, UA: NEGATIVE
KETONES UA: NEGATIVE
Leukocytes, UA: NEGATIVE
Nitrite, UA: POSITIVE
PH UA: 5
SPEC GRAV UA: 1.02
UROBILINOGEN UA: NEGATIVE

## 2013-08-04 MED ORDER — CLOTRIMAZOLE 1 % EX CREA: 1.0000 "application " | TOPICAL_CREAM | Freq: Two times a day (BID) | CUTANEOUS | Status: DC

## 2013-08-04 NOTE — Progress Notes (Signed)
Patient ID: Judy Bailey, female   DOB: 25-Dec-1975, 38 y.o.   MRN: [id number]  C/O skin irritation--groin  HPI Judy Bailey is a 38 y.o. female.  See above.  Also c/o recurrent "boils" on the skin of the vulva.  HPI  Past Medical History  Diagnosis Date  . Other and unspecified hyperlipidemia   . Unspecified essential hypertension   . Unspecified sleep apnea   . Backache, unspecified   . Other nonspecific finding on examination of urine   . Leukorrhea, not specified as infective   . Other symptoms involving digestive system(787.99)   . Absence of menstruation   . Body mass index 30.0-30.9, adult   . Tobacco use disorder   . Palpitations   . Dyspepsia and other specified disorders of function of stomach   . Trichomonal vulvovaginitis   . Restless legs syndrome (RLS)   . Insomnia, unspecified   . Depressive disorder, not elsewhere classified   . Attention deficit disorder without mention of hyperactivity   . Agoraphobia with panic disorder   . Disturbance of skin sensation   . Ovarian cancer   . Anxiety   . Bipolar disorder   . Night terrors   . PTSD (post-traumatic stress disorder)   . Borderline personality disorder     Past Surgical History  Procedure Laterality Date  . Tonsillectomy    . Tympanic membrane  2009  . Knee cartilage surgery      right    Family History  Problem Relation Age of Onset  . Prostate cancer Maternal Grandfather   . Liver disease Maternal Grandfather   . Breast cancer Paternal Grandmother   . Diabetes Paternal Grandmother   . Bipolar disorder Paternal Grandmother   . Alzheimer's disease Paternal Grandfather   . Parkinson's disease Paternal Grandfather   . Hypertension Brother   . Hypertension Father   . Diabetes Father   . Heart disease Father   . Depression Father   . Colon polyps Father   . Other Father     mad cow disease  . Bipolar disorder Sister   . Colon polyps Brother   . Colon polyps Mother   . Kidney disease Mother     . Clotting disorder Maternal Grandmother   . Irritable bowel syndrome Sister   . Irritable bowel syndrome Paternal Aunt   . Bipolar disorder Paternal Aunt   . Bipolar disorder Cousin     Social History History  Substance Use Topics  . Smoking status: Current Every Day Smoker -- 1.00 packs/day for 22 years    Types: Cigarettes  . Smokeless tobacco: Never Used  . Alcohol Use: No    No Known Allergies  Current Outpatient Prescriptions  Medication Sig Dispense Refill  . alprazolam (XANAX) 2 MG tablet Take 2 mg by mouth 3 (three) times daily as needed for sleep.       Marland Kitchen amphetamine-dextroamphetamine (ADDERALL) 30 MG tablet Take 30 mg by mouth daily.      Marland Kitchen atorvastatin (LIPITOR) 40 MG tablet Take 40 mg by mouth daily.      . cloNIDine (CATAPRES) 0.1 MG tablet Take 0.2 mg by mouth daily.       Marland Kitchen desvenlafaxine (PRISTIQ) 100 MG 24 hr tablet Take 1 tablet (100 mg total) by mouth daily.  30 tablet  0  . divalproex (DEPAKOTE) 500 MG DR tablet TAKE 2 TABLETS BY MOUTH AT BEDTIME  60 tablet  0  . ibuprofen (ADVIL,MOTRIN) 800 MG tablet Take 1 tablet (  800 mg total) by mouth every 8 (eight) hours as needed.  30 tablet  5  . Lurasidone HCl (LATUDA) 60 MG TABS Take 80 mg by mouth.       Marland Kitchen omeprazole (PRILOSEC) 20 MG capsule Take 20 mg by mouth daily.      . Prenat-FeFmCb-DSS-FA-DHA w/o A (CITRANATAL HARMONY) 27-1-260 MG CAPS Take 1 capsule by mouth daily before breakfast.  30 capsule  11  . propranolol ER (INDERAL LA) 120 MG 24 hr capsule Take 120 mg by mouth daily.      Marland Kitchen zolpidem (AMBIEN) 10 MG tablet Take 10 mg by mouth at bedtime as needed for sleep.      Marland Kitchen amphetamine-dextroamphetamine (ADDERALL XR) 30 MG 24 hr capsule Take 1 capsule (30 mg total) by mouth daily.  30 capsule  0  . clindamycin (CLEOCIN) 300 MG capsule Take 1 capsule (300 mg total) by mouth 3 (three) times daily.  21 capsule  0  . clotrimazole (LOTRIMIN) 1 % cream Apply 1 application topically 2 (two) times daily.  30 g  0  .  fluconazole (DIFLUCAN) 150 MG tablet Take 1 tablet (150 mg total) by mouth once.  1 tablet  2  . metroNIDAZOLE (FLAGYL) 500 MG tablet Take 1 tablet (500 mg total) by mouth 2 (two) times daily.  14 tablet  2   No current facility-administered medications for this visit.    Review of Systems Review of Systems Constitutional: negative for fatigue and weight loss Respiratory: negative for cough and wheezing Cardiovascular: negative for chest pain, fatigue and palpitations Gastrointestinal: negative for abdominal pain and change in bowel habits Genitourinary: positive for skin irritation Integument/breast: negative for nipple discharge Musculoskeletal:negative for myalgias Neurological: negative for gait problems and tremors Behavioral/Psych: negative for abusive relationship, depression Endocrine: negative for temperature intolerance     Blood pressure 134/85, pulse 83, weight 87.998 kg (194 lb), last menstrual period 07/21/2013.  Physical Exam Physical Exam  Genitourinary:      General:   alert  Pelvis:  External genitalia: scarring; erythematous area--intertriginous bilaterally       Data Reviewed None  Assessment    Cutaneous candida ?Community-acquired MRSA    Plan    Orders Placed This Encounter  Procedures  . MRSA culture   Meds ordered this encounter  Medications  . amphetamine-dextroamphetamine (ADDERALL) 30 MG tablet    Sig: Take 30 mg by mouth daily.  . clotrimazole (LOTRIMIN) 1 % cream    Sig: Apply 1 application topically 2 (two) times daily.    Dispense:  30 g    Refill:  0   Supportive measures reviewed Follow up as needed.         JACKSON-MOORE,Trinetta Alemu A 08/04/2013, 6:11 PM

## 2013-08-04 NOTE — Patient Instructions (Signed)
Community-Associated MRSA CA-MRSA stands for community-associated methicillin-resistant Staphylococcus aureus. MRSA is a type of bacteria that is resistant to some common antibiotics. It can cause infections in the skin and many other places in the body. Staphylococcus aureus, often called "staph," is a bacteria that normally lives on the skin or in the nose. Staph on the surface of the skin or in the nose does not cause problems. However, if the staph enters the body through a cut, wound, or break in the skin, an infection can happen. Up until recently, infections with the MRSA type of staph mainly occurred in hospitals and other healthcare settings. There are now increasing problems with MRSA infections in the community as well. Infections with MRSA may be very serious or even life-threatening. CA-MRSA is becoming more common. It is known to spread in crowded settings, in jails and prisons, and in situations where there is close skin-to-skin contact, such as during sporting events or in locker rooms. MRSA can be spread through shared items, such as children's toys, razors, towels, or sports equipment.  CAUSES All staph, including MRSA, are normally harmless unless they enter the body through a scratch, cut, or wound, such as with surgery. All staph, including MRSA, can be spread from person-to-person by touching contaminated objects or through direct contact.  MRSA now causes illness in people who have not been in hospitals or other healthcare facilities. Cases of MRSA diseases in the community have been associated with:  Recent antibiotic use.  Sharing contaminated towels or clothes.  Having active skin diseases.  Participating in contact sports.  Living in crowded settings.  Intravenous (IV) drug use.  Community-associated MRSA infections are usually skin infections, but may cause other severe illnesses.  Staph bacteria are one of the most common causes of skin infection. However, they are  also a common cause of pneumonia, bone or joint infections, and bloodstream infections. DIAGNOSIS Diagnosis of MRSA is done by cultures of fluid samples that may come from:  Swabs taken from cuts or wounds in infected areas.  Nasal swabs.  Saliva or deep cough specimens from the lungs (sputum).  Urine.  Blood. Many people are "colonized" with MRSA but have no signs of infection. This means that people carry the MRSA germ on their skin or in their nose and may never develop MRSA infection.  TREATMENT  Treatment varies and is based on how serious, how deep, or how extensive the infection is. For example:  Some skin infections, such as a small boil or abscess, may be treated by draining yellowish-white fluid (pus) from the site of the infection.  Deeper or more widespread soft tissue infections are usually treated with surgery to drain pus and with antibiotic medicine given by vein or by mouth. This may be recommended even if you are pregnant.  Serious infections may require a hospital stay. If antibiotics are given, they may be needed for several weeks. PREVENTION Because many people are colonized with staph, including MRSA, preventing the spread of the bacteria from person-to-person is most important. The best way to prevent the spread of bacteria and other germs is through proper hand washing or by using alcohol-based hand disinfectants. The following are other ways to help prevent MRSA infection within community settings.   Wash your hands frequently with soap and water for at least 15 seconds. Otherwise, use alcohol-based hand disinfectants when soap and water is not available.  Make sure people who live with you wash their hands often, too.  Do not share  personal items. For example, avoid sharing razors and other personal hygiene items, towels, clothing, and athletic equipment.  Wash and dry your clothes and bedding at the warmest temperatures recommended on the labels.  Keep  wounds covered. Pus from infected sores may contain MRSA and other bacteria. Keep cuts and abrasions clean and covered with germ-free (sterile), dry bandages until they are healed.  If you have a wound that appears infected, ask your caregiver if a culture for MRSA and other bacteria should be done.  If you are breastfeeding, talk to your caregiver about MRSA. You may be asked to temporarily stop breastfeeding. HOME CARE INSTRUCTIONS   Take your antibiotics as directed. Finish them even if you start to feel better.  Avoid close contact with those around you as much as possible. Do not use towels, razors, toothbrushes, bedding, or other items that will be used by others.  To fight the infection, follow your caregiver's instructions for wound care. Wash your hands before and after changing your bandages.  If you have an intravascular device, such as a catheter, make sure you know how to care for it.  Be sure to tell any healthcare providers that you have MRSA so they are aware of your infection. SEEK IMMEDIATE MEDICAL CARE IF:  The infection appears to be getting worse. Signs include:  Increased warmth, redness, or tenderness around the wound site.  A red line that extends from the infection site.  A dark color in the area around the infection.  Wound drainage that is tan, yellow, or green.  A bad smell coming from the wound.  You feel sick to your stomach (nauseous) and throw up (vomit) or cannot keep medicine down.  You have a fever.  Your baby is older than 3 months with a rectal temperature of 102 F (38.9 C) or higher.  Your baby is 17 months old or younger with a rectal temperature of 100.4 F (38 C) or higher.  You have difficulty breathing. MAKE SURE YOU:   Understand these instructions.  Will watch your condition.  Will get help right away if you are not doing well or get worse. Document Released: 04/14/2005 Document Revised: 04/03/2011 Document Reviewed:  04/14/2010 [facility] Patient Information 2015 [place], [place]. This information is not intended to replace advice given to you by your health care provider. Make sure you discuss any questions you have with your health care provider. Cutaneous Candidiasis Cutaneous candidiasis is a condition in which there is an overgrowth of yeast (candida) on the skin. Yeast normally live on the skin, but in small enough numbers not to cause any symptoms. In certain cases, increased growth of the yeast may cause an actual yeast infection. This kind of infection usually occurs in areas of the skin that are constantly warm and moist, such as the armpits or the groin. Yeast is the most common cause of diaper rash in babies and in people who cannot control their bowel movements (incontinence). CAUSES  The fungus that most often causes cutaneous candidiasis is Candida albicans. Conditions that can increase the risk of getting a yeast infection of the skin include:  Obesity.  Pregnancy.  Diabetes.  Taking antibiotic medicine.  Taking birth control pills.  Taking steroid medicines.  Thyroid disease.  An iron or zinc deficiency.  Problems with the immune system. SYMPTOMS   Red, swollen area of the skin.  Bumps on the skin.  Itchiness. DIAGNOSIS  The diagnosis of cutaneous candidiasis is usually based on its appearance. Light  scrapings of the skin may also be taken and viewed under a microscope to identify the presence of yeast. TREATMENT  Antifungal creams may be applied to the infected skin. In severe cases, oral medicines may be needed.  HOME CARE INSTRUCTIONS   Keep your skin clean and dry.  Maintain a healthy weight.  If you have diabetes, keep your blood sugar under control. SEEK IMMEDIATE MEDICAL CARE IF:  Your rash continues to spread despite treatment.  You have a fever, chills, or abdominal pain. Document Released: 09/27/2010 Document Revised: 04/03/2011 Document Reviewed:  09/27/2010 [facility] Patient Information 2015 [place], [place]. This information is not intended to replace advice given to you by your health care provider. Make sure you discuss any questions you have with your health care provider.

## 2013-08-05 ENCOUNTER — Telehealth: Payer: Self-pay | Admitting: *Deleted

## 2013-08-05 ENCOUNTER — Other Ambulatory Visit: Payer: Self-pay | Admitting: *Deleted

## 2013-08-05 DIAGNOSIS — R399 Unspecified symptoms and signs involving the genitourinary system: Secondary | ICD-10-CM

## 2013-08-05 NOTE — Addendum Note (Signed)
Addended by: Lewie Loron D on: 08/05/2013 01:47 PM   Modules accepted: Orders

## 2013-08-05 NOTE — Telephone Encounter (Signed)
Pt called stating that her pharmacy did not have the prescription she was to receive after office visit on 08/04/13.   Return call to pt to verify pharmacy and that was the problem, pt states that she thought Rx, Lotrimin, was sent to a different pharmacy. Pt states that she is able to get Rx at pharmacy that it was sent.

## 2013-08-06 NOTE — Addendum Note (Signed)
Addended by: Ladona Ridgel on: 08/06/2013 10:00 AM   Modules accepted: Orders

## 2013-08-07 ENCOUNTER — Telehealth: Payer: Self-pay | Admitting: *Deleted

## 2013-08-07 LAB — URINE CULTURE: Colony Count: >=100000

## 2013-08-07 LAB — MRSA CULTURE

## 2013-08-07 NOTE — Telephone Encounter (Signed)
Pt placed call to office regarding her lab results, MRSA culture. Return call made to pt once labs were resulted.  Pt made aware that MRSA culture was negative.  Pt advised to use Lotrimin cream as needed for yeast.   Pt would like to know what to do from this point, knowing there is no MRSA.  Pt states that this "boil" returns every month and is quite painful. Pt made aware we would f/u with Dr Delsa Sale in regards to this matter.    Please advise on Dx of patient complaint and plan of care.

## 2013-08-17 NOTE — Telephone Encounter (Signed)
Reviewed preventive measures at last visit--loose fitting clothing, avoid trauma to skin, Hibiclens 3 times/week

## 2013-08-19 ENCOUNTER — Other Ambulatory Visit: Payer: Self-pay | Admitting: *Deleted

## 2013-08-19 DIAGNOSIS — N390 Urinary tract infection, site not specified: Secondary | ICD-10-CM

## 2013-08-19 MED ORDER — FLUCONAZOLE 150 MG PO TABS: 150.0000 mg | ORAL_TABLET | Freq: Once | ORAL | Status: DC

## 2013-08-19 MED ORDER — SULFAMETHOXAZOLE-TMP DS 800-160 MG PO TABS: 1.0000 | ORAL_TABLET | Freq: Two times a day (BID) | ORAL | Status: DC

## 2013-08-19 NOTE — Telephone Encounter (Signed)
Left message with female to have pt call the office at her convenience.

## 2013-09-03 NOTE — Telephone Encounter (Signed)
No response from patient- call refiled.

## 2013-10-27 ENCOUNTER — Ambulatory Visit: Payer: Medicaid Other | Admitting: Obstetrics

## 2013-11-24 ENCOUNTER — Encounter: Payer: Self-pay | Admitting: Obstetrics & Gynecology

## 2014-01-19 ENCOUNTER — Encounter: Payer: Self-pay | Admitting: *Deleted

## 2014-01-20 ENCOUNTER — Encounter: Payer: Self-pay | Admitting: Obstetrics & Gynecology

## 2014-02-25 DIAGNOSIS — M25561 Pain in right knee: Secondary | ICD-10-CM | POA: Insufficient documentation

## 2014-02-25 DIAGNOSIS — R5383 Other fatigue: Secondary | ICD-10-CM | POA: Insufficient documentation

## 2014-02-25 DIAGNOSIS — N921 Excessive and frequent menstruation with irregular cycle: Secondary | ICD-10-CM | POA: Insufficient documentation

## 2014-02-25 DIAGNOSIS — E538 Deficiency of other specified B group vitamins: Secondary | ICD-10-CM | POA: Insufficient documentation

## 2014-03-08 ENCOUNTER — Emergency Department (HOSPITAL_COMMUNITY)
Admission: EM | Admit: 2014-03-08 | Discharge: 2014-03-08 | Disposition: A | Discharge status: Home / Self Care | Payer: Medicare Other | Attending: Emergency Medicine | Admitting: Emergency Medicine

## 2014-03-08 ENCOUNTER — Encounter (HOSPITAL_COMMUNITY): Payer: Self-pay | Admitting: Emergency Medicine

## 2014-03-08 DIAGNOSIS — F4310 Post-traumatic stress disorder, unspecified: Secondary | ICD-10-CM | POA: Insufficient documentation

## 2014-03-08 DIAGNOSIS — Z720 Tobacco use: Secondary | ICD-10-CM | POA: Diagnosis not present

## 2014-03-08 DIAGNOSIS — F329 Major depressive disorder, single episode, unspecified: Secondary | ICD-10-CM | POA: Diagnosis not present

## 2014-03-08 DIAGNOSIS — E119 Type 2 diabetes mellitus without complications: Secondary | ICD-10-CM | POA: Insufficient documentation

## 2014-03-08 DIAGNOSIS — I10 Essential (primary) hypertension: Secondary | ICD-10-CM | POA: Insufficient documentation

## 2014-03-08 DIAGNOSIS — Z79899 Other long term (current) drug therapy: Secondary | ICD-10-CM | POA: Insufficient documentation

## 2014-03-08 DIAGNOSIS — N390 Urinary tract infection, site not specified: Secondary | ICD-10-CM

## 2014-03-08 DIAGNOSIS — Z8669 Personal history of other diseases of the nervous system and sense organs: Secondary | ICD-10-CM | POA: Diagnosis not present

## 2014-03-08 DIAGNOSIS — K529 Noninfective gastroenteritis and colitis, unspecified: Secondary | ICD-10-CM

## 2014-03-08 DIAGNOSIS — F410 Panic disorder [episodic paroxysmal anxiety] without agoraphobia: Secondary | ICD-10-CM | POA: Insufficient documentation

## 2014-03-08 DIAGNOSIS — Z8543 Personal history of malignant neoplasm of ovary: Secondary | ICD-10-CM | POA: Insufficient documentation

## 2014-03-08 DIAGNOSIS — Z3202 Encounter for pregnancy test, result negative: Secondary | ICD-10-CM | POA: Diagnosis not present

## 2014-03-08 DIAGNOSIS — Z8742 Personal history of other diseases of the female genital tract: Secondary | ICD-10-CM | POA: Insufficient documentation

## 2014-03-08 DIAGNOSIS — R55 Syncope and collapse: Secondary | ICD-10-CM | POA: Diagnosis present

## 2014-03-08 HISTORY — DX: Type 2 diabetes mellitus without complications: E11.9

## 2014-03-08 LAB — BASIC METABOLIC PANEL
Anion gap: 7 (ref 5–15)
BUN: 7 mg/dL (ref 6–23)
CO2: 24 mmol/L (ref 19–32)
CREATININE: 0.79 mg/dL (ref 0.50–1.10)
Calcium: 9.2 mg/dL (ref 8.4–10.5)
Chloride: 106 mmol/L (ref 96–112)
Glucose, Bld: 160 mg/dL — ABNORMAL HIGH (ref 70–99)
Potassium: 3.9 mmol/L (ref 3.5–5.1)
Sodium: 137 mmol/L (ref 135–145)

## 2014-03-08 LAB — CBC
HEMATOCRIT: 45.7 % (ref 36.0–46.0)
HEMOGLOBIN: 15.3 g/dL — AB (ref 12.0–15.0)
MCH: 33.5 pg (ref 26.0–34.0)
MCHC: 33.5 g/dL (ref 30.0–36.0)
MCV: 100 fL (ref 78.0–100.0)
Platelets: 302 10*3/uL (ref 150–400)
RBC: 4.57 MIL/uL (ref 3.87–5.11)
RDW: 12.7 % (ref 11.5–15.5)
WBC: 10.1 10*3/uL (ref 4.0–10.5)

## 2014-03-08 LAB — URINE MICROSCOPIC-ADD ON

## 2014-03-08 LAB — URINALYSIS, ROUTINE W REFLEX MICROSCOPIC
Bilirubin Urine: NEGATIVE
Glucose, UA: NEGATIVE mg/dL
Ketones, ur: NEGATIVE mg/dL
NITRITE: NEGATIVE
PH: 6 (ref 5.0–8.0)
Protein, ur: NEGATIVE mg/dL
SPECIFIC GRAVITY, URINE: 1.019 (ref 1.005–1.030)
Urobilinogen, UA: 0.2 mg/dL (ref 0.0–1.0)

## 2014-03-08 LAB — PREGNANCY, URINE: Preg Test, Ur: NEGATIVE

## 2014-03-08 MED ORDER — ONDANSETRON HCL 4 MG/2ML IJ SOLN
4.0000 mg | Freq: Once | INTRAMUSCULAR | Status: AC
  Administered 2014-03-08: 4 mg via INTRAVENOUS
  Filled 2014-03-08: qty 2

## 2014-03-08 MED ORDER — DIPHENOXYLATE-ATROPINE 2.5-0.025 MG PO TABS
2.0000 | ORAL_TABLET | Freq: Once | ORAL | Status: AC
  Administered 2014-03-08: 2 via ORAL
  Filled 2014-03-08: qty 2

## 2014-03-08 MED ORDER — ONDANSETRON 4 MG PO TBDP: 4.0000 mg | ORAL_TABLET | Freq: Three times a day (TID) | ORAL | Status: DC | PRN

## 2014-03-08 MED ORDER — DIPHENOXYLATE-ATROPINE 2.5-0.025 MG PO TABS: 1.0000 | ORAL_TABLET | Freq: Four times a day (QID) | ORAL | Status: DC | PRN

## 2014-03-08 MED ORDER — DEXTROSE 5 % IV SOLN
1.0000 g | Freq: Once | INTRAVENOUS | Status: AC
  Administered 2014-03-08: 1 g via INTRAVENOUS
  Filled 2014-03-08: qty 10

## 2014-03-08 MED ORDER — SODIUM CHLORIDE 0.9 % IV BOLUS (SEPSIS)
1000.0000 mL | Freq: Once | INTRAVENOUS | Status: AC
  Administered 2014-03-08: 1000 mL via INTRAVENOUS

## 2014-03-08 NOTE — ED Provider Notes (Addendum)
CSN: [id number]     Arrival date & time 03/08/14  1353 History   First MD Initiated Contact with Patient 03/08/14 1456     Chief Complaint  Patient presents with  . Loss of Consciousness     HPI  Vision presents for evaluation after an episode where she was standing for a refrigerated and up on the floor. She does not think she lost consciousness. However, she was feeling weak and dizzy all walking down the hall while standing from a refrigerator. She been sick with vomiting and diarrhea for over a week. Seen by primary care 2 days ago and diagnosed with UTI. Filled her prescription today but has not taken her first dose yet. Still has dysuria. No flank pain or abdominal pain. No blood in her stools. No blood in her emesis.  Past Medical History  Diagnosis Date  . Other and unspecified hyperlipidemia   . Unspecified essential hypertension   . Unspecified sleep apnea   . Backache, unspecified   . Other nonspecific finding on examination of urine   . Leukorrhea, not specified as infective   . Other symptoms involving digestive system(787.99)   . Absence of menstruation   . Body mass index 30.0-30.9, adult   . Tobacco use disorder   . Palpitations   . Dyspepsia and other specified disorders of function of stomach   . Trichomonal vulvovaginitis   . Restless legs syndrome (RLS)   . Insomnia, unspecified   . Depressive disorder, not elsewhere classified   . Attention deficit disorder without mention of hyperactivity   . Agoraphobia with panic disorder   . Disturbance of skin sensation   . Ovarian cancer   . Anxiety   . Bipolar disorder   . Night terrors   . PTSD (post-traumatic stress disorder)   . Borderline personality disorder   . Diabetes mellitus without complication    Past Surgical History  Procedure Laterality Date  . Tonsillectomy    . Tympanic membrane  2009  . Knee cartilage surgery      right   Family History  Problem Relation Age of Onset  . Prostate cancer  Maternal Grandfather   . Liver disease Maternal Grandfather   . Breast cancer Paternal Grandmother   . Diabetes Paternal Grandmother   . Bipolar disorder Paternal Grandmother   . Alzheimer's disease Paternal Grandfather   . Parkinson's disease Paternal Grandfather   . Hypertension Brother   . Hypertension Father   . Diabetes Father   . Heart disease Father   . Depression Father   . Colon polyps Father   . Other Father     mad cow disease  . Bipolar disorder Sister   . Colon polyps Brother   . Colon polyps Mother   . Kidney disease Mother   . Clotting disorder Maternal Grandmother   . Irritable bowel syndrome Sister   . Irritable bowel syndrome Paternal Aunt   . Bipolar disorder Paternal Aunt   . Bipolar disorder Cousin    History  Substance Use Topics  . Smoking status: Current Every Day Smoker -- 1.00 packs/day for 22 years    Types: Cigarettes  . Smokeless tobacco: Never Used  . Alcohol Use: No   OB History    Gravida Para Term Preterm AB TAB SAB Ectopic Multiple Living   1 1 1       1      Review of Systems  Constitutional: Negative for fever, chills, diaphoresis, appetite change and fatigue.  HENT:  Negative for mouth sores, sore throat and trouble swallowing.   Eyes: Negative for visual disturbance.  Respiratory: Negative for cough, chest tightness, shortness of breath and wheezing.   Cardiovascular: Negative for chest pain.  Gastrointestinal: Positive for nausea, vomiting and diarrhea. Negative for abdominal pain and abdominal distention.  Endocrine: Negative for polydipsia, polyphagia and polyuria.  Genitourinary: Positive for dysuria, urgency and frequency. Negative for hematuria.  Musculoskeletal: Negative for gait problem.  Skin: Negative for color change, pallor and rash.  Neurological: Positive for syncope and weakness. Negative for dizziness, light-headedness and headaches.  Hematological: Does not bruise/bleed easily.  Psychiatric/Behavioral: Negative for  behavioral problems and confusion.      Allergies  Review of patient's allergies indicates no known allergies.  Home Medications   Prior to Admission medications   Medication Sig Start Date End Date Taking? Authorizing Provider  alprazolam Duanne Moron) 2 MG tablet Take 2 mg by mouth 3 (three) times daily as needed for sleep.    Yes Historical Provider, MD  amphetamine-dextroamphetamine (ADDERALL XR) 30 MG 24 hr capsule Take 30 mg by mouth daily.  10/17/13  Yes Historical Provider, MD  amphetamine-dextroamphetamine (ADDERALL) 30 MG tablet Take 30 mg by mouth 2 (two) times daily.    Yes Historical Provider, MD  atorvastatin (LIPITOR) 40 MG tablet Take 40 mg by mouth daily.   Yes Historical Provider, MD  cloNIDine (CATAPRES) 0.1 MG tablet Take 0.2 mg by mouth daily.    Yes Historical Provider, MD  desvenlafaxine (PRISTIQ) 100 MG 24 hr tablet Take 1 tablet (100 mg total) by mouth daily. 09/03/12  Yes Kathlee Nations, MD  ibuprofen (ADVIL,MOTRIN) 800 MG tablet Take 1 tablet (800 mg total) by mouth every 8 (eight) hours as needed. 04/09/13  Yes Shelly Bombard, MD  medroxyPROGESTERone (DEPO-PROVERA) 150 MG/ML injection Inject 150 mg into the muscle every 3 (three) months.   Yes Historical Provider, MD  omeprazole (PRILOSEC) 20 MG capsule Take 20 mg by mouth daily.   Yes Historical Provider, MD  propranolol ER (INDERAL LA) 120 MG 24 hr capsule Take 120 mg by mouth daily.   Yes Historical Provider, MD  traMADol (ULTRAM) 50 MG tablet Take 50 mg by mouth every 6 (six) hours as needed for moderate pain.   Yes Historical Provider, MD  zolpidem (AMBIEN) 10 MG tablet Take 10 mg by mouth at bedtime as needed for sleep.   Yes Historical Provider, MD  clindamycin (CLEOCIN) 300 MG capsule Take 1 capsule (300 mg total) by mouth 3 (three) times daily. Patient not taking: Reported on 03/08/2014 04/09/13   Shelly Bombard, MD  clotrimazole (LOTRIMIN) 1 % cream Apply 1 application topically 2 (two) times daily. Patient  not taking: Reported on 03/08/2014 08/04/13   Lahoma Crocker, MD  diphenoxylate-atropine (LOMOTIL) 2.5-0.025 MG per tablet Take 1 tablet by mouth 4 (four) times daily as needed for diarrhea or loose stools. 03/08/14   Tanna Furry, MD  divalproex (DEPAKOTE) 500 MG DR tablet TAKE 2 TABLETS BY MOUTH AT BEDTIME Patient not taking: Reported on 03/08/2014 09/03/12   Kathlee Nations, MD  fluconazole (DIFLUCAN) 150 MG tablet Take 1 tablet (150 mg total) by mouth once. Patient not taking: Reported on 03/08/2014 04/09/13   Shelly Bombard, MD  fluconazole (DIFLUCAN) 150 MG tablet Take 1 tablet (150 mg total) by mouth once. Patient not taking: Reported on 03/08/2014 08/19/13   Lahoma Crocker, MD  metroNIDAZOLE (FLAGYL) 500 MG tablet Take 1 tablet (500 mg total) by mouth 2 (  two) times daily. Patient not taking: Reported on 03/08/2014 04/24/13   Shelly Bombard, MD  ondansetron (ZOFRAN ODT) 4 MG disintegrating tablet Take 1 tablet (4 mg total) by mouth every 8 (eight) hours as needed for nausea. 03/08/14   Tanna Furry, MD  sulfamethoxazole-trimethoprim (BACTRIM DS) 800-160 MG per tablet Take 1 tablet by mouth 2 (two) times daily. Patient not taking: Reported on 03/08/2014 08/19/13   Lahoma Crocker, MD   BP 110/62 mmHg  Pulse 57  Temp(Src) 97.8 F (36.6 C) (Oral)  Resp 16  SpO2 99% Physical Exam  Constitutional: She is oriented to person, place, and time. She appears well-developed and well-nourished. No distress.  HENT:  Head: Normocephalic.  Eyes: Conjunctivae are normal. Pupils are equal, round, and reactive to light. No scleral icterus.  Neck: Normal range of motion. Neck supple. No thyromegaly present.  Cardiovascular: Normal rate and regular rhythm.  Exam reveals no gallop and no friction rub.   No murmur heard. Pulmonary/Chest: Effort normal and breath sounds normal. No respiratory distress. She has no wheezes. She has no rales.  Abdominal: Soft. Bowel sounds are normal. She exhibits no distension.  There is no tenderness. There is no rebound.  Musculoskeletal: Normal range of motion.  Neurological: She is alert and oriented to person, place, and time.  Skin: Skin is warm and dry. No rash noted.  Psychiatric: She has a normal mood and affect. Her behavior is normal.    ED Course  Procedures (including critical care time) Labs Review Labs Reviewed  CBC - Abnormal; Notable for the following:    Hemoglobin 15.3 (*)    All other components within normal limits  BASIC METABOLIC PANEL - Abnormal; Notable for the following:    Glucose, Bld 160 (*)    All other components within normal limits  URINALYSIS, ROUTINE W REFLEX MICROSCOPIC - Abnormal; Notable for the following:    Color, Urine AMBER (*)    APPearance TURBID (*)    Hgb urine dipstick SMALL (*)    Leukocytes, UA MODERATE (*)    All other components within normal limits  URINE MICROSCOPIC-ADD ON - Abnormal; Notable for the following:    Squamous Epithelial / LPF FEW (*)    Bacteria, UA MANY (*)    All other components within normal limits  URINE CULTURE  PREGNANCY, URINE    Imaging Review No results found.   EKG Interpretation None      MDM   Final diagnoses:  UTI (lower urinary tract infection)  Gastroenteritis    Patient reevaluated relation tach. Years still appears infected. After IV fluids, Lomotil, Zofran she is feeling much improved. Given IV Rocephin. Plan is discharged home. Rest hydration and continue antibiotics recheck with any worsening.    Tanna Furry, MD 03/08/14 1816  Tanna Furry, MD 03/08/14 1947

## 2014-03-08 NOTE — ED Notes (Signed)
Bed: EM33 Expected date:  Expected time:  Means of arrival:  Comments: Near syncope

## 2014-03-08 NOTE — ED Notes (Addendum)
Pt from home. Reports syncopal episode today when she was walking to her refrigerator. Pt denies any pain. Pt reports she has felt more tired than usual lately. Pt has bipolar and feels more depressed recently, no SI. No n/v/d. Recently diagnosed with diabetes and a kidney infection, has not started abx yet. Pt also reports she has had vaginal bleeding for the past month

## 2014-03-08 NOTE — Discharge Instructions (Signed)
Urinary Tract Infection A urinary tract infection (UTI) can occur any place along the urinary tract. The tract includes the kidneys, ureters, bladder, and urethra. A type of germ called bacteria often causes a UTI. UTIs are often helped with antibiotic medicine.  HOME CARE   If given, take antibiotics as told by your doctor. Finish them even if you start to feel better.  Drink enough fluids to keep your pee (urine) clear or pale yellow.  Avoid tea, drinks with caffeine, and bubbly (carbonated) drinks.  Pee often. Avoid holding your pee in for a long time.  Pee before and after having sex (intercourse).  Wipe from front to back after you poop (bowel movement) if you are a woman. Use each tissue only once. GET HELP RIGHT AWAY IF:   You have back pain.  You have lower belly (abdominal) pain.  You have chills.  You feel sick to your stomach (nauseous).  You throw up (vomit).  Your burning or discomfort with peeing does not go away.  You have a fever.  Your symptoms are not better in 3 days. MAKE SURE YOU:   Understand these instructions.  Will watch your condition.  Will get help right away if you are not doing well or get worse. Document Released: 06/28/2007 Document Revised: 10/04/2011 Document Reviewed: 08/10/2011 [facility] Patient Information 2015 [place], [place]. This information is not intended to replace advice given to you by your health care provider. Make sure you discuss any questions you have with your health care provider.  Viral Gastroenteritis Viral gastroenteritis is also called stomach flu. This illness is caused by a certain type of germ (virus). It can cause sudden watery poop (diarrhea) and throwing up (vomiting). This can cause you to lose body fluids (dehydration). This illness usually lasts for 3 to 8 days. It usually goes away on its own. HOME CARE   Drink enough fluids to keep your pee (urine) clear or pale yellow. Drink small amounts of fluids  often.  Ask your doctor how to replace body fluid losses (rehydration).  Avoid:  Foods high in sugar.  Alcohol.  Bubbly (carbonated) drinks.  Tobacco.  Juice.  Caffeine drinks.  Very hot or cold fluids.  Fatty, greasy foods.  Eating too much at one time.  Dairy products until 24 to 48 hours after your watery poop stops.  You may eat foods with active cultures (probiotics). They can be found in some yogurts and supplements.  Wash your hands well to avoid spreading the illness.  Only take medicines as told by your doctor. Do not give aspirin to children. Do not take medicines for watery poop (antidiarrheals).  Ask your doctor if you should keep taking your regular medicines.  Keep all doctor visits as told. GET HELP RIGHT AWAY IF:   You cannot keep fluids down.  You do not pee at least once every 6 to 8 hours.  You are short of breath.  You see blood in your poop or throw up. This may look like coffee grounds.  You have belly (abdominal) pain that gets worse or is just in one small spot (localized).  You keep throwing up or having watery poop.  You have a fever.  The patient is a child younger than 3 months, and he or she has a fever.  The patient is a child older than 3 months, and he or she has a fever and problems that do not go away.  The patient is a child older than 3  months, and he or she has a fever and problems that suddenly get worse.  The patient is a baby, and he or she has no tears when crying. MAKE SURE YOU:   Understand these instructions.  Will watch your condition.  Will get help right away if you are not doing well or get worse. Document Released: 06/28/2007 Document Revised: 04/03/2011 Document Reviewed: 10/26/2010 [facility] Patient Information 2015 [place], [place]. This information is not intended to replace advice given to you by your health care provider. Make sure you discuss any questions you have with your health care  provider.

## 2014-03-10 LAB — URINE CULTURE: Colony Count: >=100000

## 2014-03-11 ENCOUNTER — Telehealth: Payer: Self-pay | Admitting: Emergency Medicine

## 2014-03-11 NOTE — Progress Notes (Signed)
ED Antimicrobial Stewardship Positive Culture Follow Up   Judy Bailey is an 39 y.o. female who presented to [facility] on 03/08/2014 with a chief complaint of  Chief Complaint  Patient presents with  . Loss of Consciousness    Recent Results (from the past 720 hour(s))  Urine culture     Status: None   Collection Time: 03/08/14  4:36 PM  Result Value Ref Range Status   Specimen Description URINE, CLEAN CATCH  Final   Special Requests NONE  Final   Colony Count   Final    >=100,000 COLONIES/ML Performed at [facility]    Culture   Final    ESCHERICHIA COLI Performed at [facility]    Report Status 03/10/2014 FINAL  Final   Organism ID, Bacteria ESCHERICHIA COLI  Final      Susceptibility   Escherichia coli - MIC*    AMPICILLIN 8 SENSITIVE Sensitive     CEFAZOLIN <=4 SENSITIVE Sensitive     CEFTRIAXONE <=1 SENSITIVE Sensitive     CIPROFLOXACIN <=0.25 SENSITIVE Sensitive     GENTAMICIN <=1 SENSITIVE Sensitive     LEVOFLOXACIN <=0.12 SENSITIVE Sensitive     NITROFURANTOIN <=16 SENSITIVE Sensitive     TOBRAMYCIN <=1 SENSITIVE Sensitive     TRIMETH/SULFA <=20 SENSITIVE Sensitive     PIP/TAZO <=4 SENSITIVE Sensitive     * ESCHERICHIA COLI   Patient c/o dysuria but no flank pain and no abdominal pain.   She was recently seen at PCP 2 days prior and prescribed Cipro 500mg  po BID for 5 days but she had not picked up the prescription yet. Contacted [facility] 2/17 and she picked up Rx.    Plan:  Complete prescription prescription prescribed by PCP Fax results to PCP  ED Provider: Jeannett Senior, PA-C   Cristy Friedlander 03/11/2014, 10:17 AM Infectious Diseases Pharmacist Phone# [phone]

## 2014-03-11 NOTE — Telephone Encounter (Signed)
Post ED Visit - Positive Culture Follow-up: Chart Hand-off to ED Flow Manager  Culture assessed and recommendations reviewed by: []  Wes Dulaney, Pharm.D., BCPS []  Heide Guile, Pharm.D., BCPS []  Alycia Rossetti, Pharm.D., BCPS []  [place], Pharm.D., BCPS, AAHIVP []  Legrand Como, Pharm .D., BCPS, AAHIVP [x]  Isac Sarna , Pharm.D.  Positive urine culture  []  Patient discharged without antimicrobial prescription and treatment is now indicated []  Organism is resistant to prescribed ED discharge antimicrobial []  Patient with positive blood cultures  Changes discussed with ED provider: Senaida Lange, [place]  Faxed to PCP Eldridge Abrahams [phone] Taking previously prescribed Cipro   Ernesta Amble 03/11/2014, 4:12 PM

## 2015-01-11 ENCOUNTER — Encounter (HOSPITAL_COMMUNITY): Payer: Self-pay | Admitting: Cardiology

## 2015-01-11 ENCOUNTER — Emergency Department (HOSPITAL_COMMUNITY): Payer: Medicare Other

## 2015-01-11 ENCOUNTER — Emergency Department (HOSPITAL_COMMUNITY)
Admission: EM | Admit: 2015-01-11 | Discharge: 2015-01-11 | Disposition: A | Discharge status: Home / Self Care | Payer: Medicare Other | Attending: Emergency Medicine | Admitting: Emergency Medicine

## 2015-01-11 DIAGNOSIS — Y9389 Activity, other specified: Secondary | ICD-10-CM | POA: Diagnosis not present

## 2015-01-11 DIAGNOSIS — S3991XA Unspecified injury of abdomen, initial encounter: Secondary | ICD-10-CM | POA: Diagnosis present

## 2015-01-11 DIAGNOSIS — Z79899 Other long term (current) drug therapy: Secondary | ICD-10-CM | POA: Diagnosis not present

## 2015-01-11 DIAGNOSIS — F4001 Agoraphobia with panic disorder: Secondary | ICD-10-CM | POA: Insufficient documentation

## 2015-01-11 DIAGNOSIS — Z8543 Personal history of malignant neoplasm of ovary: Secondary | ICD-10-CM | POA: Insufficient documentation

## 2015-01-11 DIAGNOSIS — F603 Borderline personality disorder: Secondary | ICD-10-CM | POA: Diagnosis not present

## 2015-01-11 DIAGNOSIS — R0781 Pleurodynia: Secondary | ICD-10-CM

## 2015-01-11 DIAGNOSIS — F17210 Nicotine dependence, cigarettes, uncomplicated: Secondary | ICD-10-CM | POA: Diagnosis not present

## 2015-01-11 DIAGNOSIS — Z8619 Personal history of other infectious and parasitic diseases: Secondary | ICD-10-CM | POA: Diagnosis not present

## 2015-01-11 DIAGNOSIS — F319 Bipolar disorder, unspecified: Secondary | ICD-10-CM | POA: Insufficient documentation

## 2015-01-11 DIAGNOSIS — F4310 Post-traumatic stress disorder, unspecified: Secondary | ICD-10-CM | POA: Insufficient documentation

## 2015-01-11 DIAGNOSIS — S20211A Contusion of right front wall of thorax, initial encounter: Secondary | ICD-10-CM

## 2015-01-11 DIAGNOSIS — Z8719 Personal history of other diseases of the digestive system: Secondary | ICD-10-CM | POA: Insufficient documentation

## 2015-01-11 DIAGNOSIS — E119 Type 2 diabetes mellitus without complications: Secondary | ICD-10-CM | POA: Insufficient documentation

## 2015-01-11 DIAGNOSIS — E785 Hyperlipidemia, unspecified: Secondary | ICD-10-CM | POA: Diagnosis not present

## 2015-01-11 DIAGNOSIS — G4700 Insomnia, unspecified: Secondary | ICD-10-CM | POA: Diagnosis not present

## 2015-01-11 DIAGNOSIS — Y998 Other external cause status: Secondary | ICD-10-CM | POA: Diagnosis not present

## 2015-01-11 DIAGNOSIS — I10 Essential (primary) hypertension: Secondary | ICD-10-CM | POA: Diagnosis not present

## 2015-01-11 DIAGNOSIS — F909 Attention-deficit hyperactivity disorder, unspecified type: Secondary | ICD-10-CM | POA: Insufficient documentation

## 2015-01-11 DIAGNOSIS — Z8742 Personal history of other diseases of the female genital tract: Secondary | ICD-10-CM | POA: Diagnosis not present

## 2015-01-11 DIAGNOSIS — Y92009 Unspecified place in unspecified non-institutional (private) residence as the place of occurrence of the external cause: Secondary | ICD-10-CM | POA: Insufficient documentation

## 2015-01-11 DIAGNOSIS — W010XXA Fall on same level from slipping, tripping and stumbling without subsequent striking against object, initial encounter: Secondary | ICD-10-CM | POA: Diagnosis not present

## 2015-01-11 MED ORDER — NAPROXEN 500 MG PO TABS: 500.0000 mg | ORAL_TABLET | Freq: Two times a day (BID) | ORAL | Status: DC

## 2015-01-11 MED ORDER — ORPHENADRINE CITRATE ER 100 MG PO TB12: 100.0000 mg | ORAL_TABLET | Freq: Two times a day (BID) | ORAL | Status: DC

## 2015-01-11 NOTE — ED Notes (Signed)
Reports she thinks she broke a rib on her right side. States she tripped over a boot and hit the wall on that side. Also report some nausea/vomiting.

## 2015-01-11 NOTE — ED Provider Notes (Signed)
CSN: [id number]     Arrival date & time 01/11/15  1217 History   First MD Initiated Contact with Patient 01/11/15 1451     Chief Complaint  Patient presents with  . Flank Pain     (Consider location/radiation/quality/duration/timing/severity/associated sxs/prior Treatment) HPI Patient reports that she tripped over her boot and fell against the corner of the wall in her home 4 days ago. She reports it continues to both be painful as well as feel numb along her side. No difficulty breathing, no coughing, no fever. Pain is worse with movement. She has been taking Aleve with some relief. No other associated injury. Past Medical History  Diagnosis Date  . Other and unspecified hyperlipidemia   . Unspecified essential hypertension   . Unspecified sleep apnea   . Backache, unspecified   . Other nonspecific finding on examination of urine   . Leukorrhea, not specified as infective   . Other symptoms involving digestive system(787.99)   . Absence of menstruation   . Body mass index 30.0-30.9, adult   . Tobacco use disorder   . Palpitations   . Dyspepsia and other specified disorders of function of stomach   . Trichomonal vulvovaginitis   . Restless legs syndrome (RLS)   . Insomnia, unspecified   . Depressive disorder, not elsewhere classified   . Attention deficit disorder without mention of hyperactivity   . Agoraphobia with panic disorder   . Disturbance of skin sensation   . Ovarian cancer ([place])   . Anxiety   . Bipolar disorder ([place])   . Night terrors   . PTSD (post-traumatic stress disorder)   . Borderline personality disorder   . Diabetes mellitus without complication [facility])    Past Surgical History  Procedure Laterality Date  . Tonsillectomy    . Tympanic membrane  2009  . Knee cartilage surgery      right   Family History  Problem Relation Age of Onset  . Prostate cancer Maternal Grandfather   . Liver disease Maternal Grandfather   . Breast cancer Paternal  Grandmother   . Diabetes Paternal Grandmother   . Bipolar disorder Paternal Grandmother   . Alzheimer's disease Paternal Grandfather   . Parkinson's disease Paternal Grandfather   . Hypertension Brother   . Hypertension Father   . Diabetes Father   . Heart disease Father   . Depression Father   . Colon polyps Father   . Other Father     mad cow disease  . Bipolar disorder Sister   . Colon polyps Brother   . Colon polyps Mother   . Kidney disease Mother   . Clotting disorder Maternal Grandmother   . Irritable bowel syndrome Sister   . Irritable bowel syndrome Paternal Aunt   . Bipolar disorder Paternal Aunt   . Bipolar disorder Cousin    Social History  Substance Use Topics  . Smoking status: Current Every Day Smoker -- 1.00 packs/day for 22 years    Types: Cigarettes  . Smokeless tobacco: Never Used  . Alcohol Use: No   OB History    Gravida Para Term Preterm AB TAB SAB Ectopic Multiple Living   1 1 1       1      Review of Systems 10 Systems reviewed and are negative for acute change except as noted in the HPI.    Allergies  Review of patient's allergies indicates no known allergies.  Home Medications   Prior to Admission medications   Medication Sig Start  Date End Date Taking? Authorizing Provider  alprazolam Duanne Moron) 2 MG tablet Take 2 mg by mouth 3 (three) times daily as needed for sleep.     Historical Provider, MD  amphetamine-dextroamphetamine (ADDERALL XR) 30 MG 24 hr capsule Take 30 mg by mouth daily.  10/17/13   Historical Provider, MD  amphetamine-dextroamphetamine (ADDERALL) 30 MG tablet Take 30 mg by mouth 2 (two) times daily.     Historical Provider, MD  atorvastatin (LIPITOR) 40 MG tablet Take 40 mg by mouth daily.    Historical Provider, MD  clindamycin (CLEOCIN) 300 MG capsule Take 1 capsule (300 mg total) by mouth 3 (three) times daily. Patient not taking: Reported on 03/08/2014 04/09/13   Shelly Bombard, MD  cloNIDine (CATAPRES) 0.1 MG tablet Take  0.2 mg by mouth daily.     Historical Provider, MD  clotrimazole (LOTRIMIN) 1 % cream Apply 1 application topically 2 (two) times daily. Patient not taking: Reported on 03/08/2014 08/04/13   Lahoma Crocker, MD  desvenlafaxine (PRISTIQ) 100 MG 24 hr tablet Take 1 tablet (100 mg total) by mouth daily. 09/03/12   Kathlee Nations, MD  diphenoxylate-atropine (LOMOTIL) 2.5-0.025 MG per tablet Take 1 tablet by mouth 4 (four) times daily as needed for diarrhea or loose stools. 03/08/14   Tanna Furry, MD  divalproex (DEPAKOTE) 500 MG DR tablet TAKE 2 TABLETS BY MOUTH AT BEDTIME Patient not taking: Reported on 03/08/2014 09/03/12   Kathlee Nations, MD  fluconazole (DIFLUCAN) 150 MG tablet Take 1 tablet (150 mg total) by mouth once. Patient not taking: Reported on 03/08/2014 04/09/13   Shelly Bombard, MD  fluconazole (DIFLUCAN) 150 MG tablet Take 1 tablet (150 mg total) by mouth once. Patient not taking: Reported on 03/08/2014 08/19/13   Lahoma Crocker, MD  ibuprofen (ADVIL,MOTRIN) 800 MG tablet Take 1 tablet (800 mg total) by mouth every 8 (eight) hours as needed. 04/09/13   Shelly Bombard, MD  medroxyPROGESTERone (DEPO-PROVERA) 150 MG/ML injection Inject 150 mg into the muscle every 3 (three) months.    Historical Provider, MD  metroNIDAZOLE (FLAGYL) 500 MG tablet Take 1 tablet (500 mg total) by mouth 2 (two) times daily. Patient not taking: Reported on 03/08/2014 04/24/13   Shelly Bombard, MD  naproxen (NAPROSYN) 500 MG tablet Take 1 tablet (500 mg total) by mouth 2 (two) times daily. 01/11/15   Charlesetta Shanks, MD  omeprazole (PRILOSEC) 20 MG capsule Take 20 mg by mouth daily.    Historical Provider, MD  ondansetron (ZOFRAN ODT) 4 MG disintegrating tablet Take 1 tablet (4 mg total) by mouth every 8 (eight) hours as needed for nausea. 03/08/14   Tanna Furry, MD  orphenadrine (NORFLEX) 100 MG tablet Take 1 tablet (100 mg total) by mouth 2 (two) times daily. 01/11/15   Charlesetta Shanks, MD  propranolol ER (INDERAL  LA) 120 MG 24 hr capsule Take 120 mg by mouth daily.    Historical Provider, MD  sulfamethoxazole-trimethoprim (BACTRIM DS) 800-160 MG per tablet Take 1 tablet by mouth 2 (two) times daily. Patient not taking: Reported on 03/08/2014 08/19/13   Lahoma Crocker, MD  traMADol (ULTRAM) 50 MG tablet Take 50 mg by mouth every 6 (six) hours as needed for moderate pain.    Historical Provider, MD  zolpidem (AMBIEN) 10 MG tablet Take 10 mg by mouth at bedtime as needed for sleep.    Historical Provider, MD   BP 128/87 mmHg  Pulse 69  Temp(Src) 98.7 F (37.1 C) (Oral)  Resp  15  SpO2 100% Physical Exam  Constitutional: She is oriented to person, place, and time. She appears well-developed and well-nourished.  HENT:  Head: Normocephalic and atraumatic.  Eyes: EOM are normal. Pupils are equal, round, and reactive to light.  Neck: Neck supple.  Cardiovascular: Normal rate, regular rhythm, normal heart sounds and intact distal pulses.   Pulmonary/Chest: Effort normal and breath sounds normal. She exhibits tenderness.  Patient has an approximately 4 cm x5cm abrasion to the right lateral chest wall. She is tender to palpation along the seventh through ninth rib margins. No crepitus. The abdomen and subchondral areas are nontender.  Abdominal: Soft. Bowel sounds are normal. She exhibits no distension. There is no tenderness.  Musculoskeletal: Normal range of motion. She exhibits no edema.  Neurological: She is alert and oriented to person, place, and time. She has normal strength. Coordination normal. GCS eye subscore is 4. GCS verbal subscore is 5. GCS motor subscore is 6.  Skin: Skin is warm, dry and intact.  Psychiatric: She has a normal mood and affect.    ED Course  Procedures (including critical care time) Labs Review Labs Reviewed - No data to display  Imaging Review Dg Ribs Unilateral W/chest Right  01/11/2015  CLINICAL DATA:  Injured 4 days ago after a fall. Right-sided rib pain. EXAM:  RIGHT RIBS AND CHEST - 3+ VIEW COMPARISON:  None. FINDINGS: No fracture or other bone lesions are seen involving the ribs. There is no evidence of pneumothorax or pleural effusion. Both lungs are clear. Heart size and mediastinal contours are within normal limits. IMPRESSION: No acute osseous injury of the right ribs. Electronically Signed   By: Kathreen Devoid   On: 01/11/2015 13:19   I have personally reviewed and evaluated these images and lab results as part of my medical decision-making.   EKG Interpretation None      MDM   Final diagnoses:  Chest wall contusion, right, initial encounter   Patient has chest wall abrasion and local pain. Chest x-ray does not show acute fracture. He also describes a numbness feeling. At this time I suspect she has intercostal nerve contusion in conjunction with her chest wall abrasion. Patient is well in appearance, she has no respiratory distress, lungs sounds are clear and chest x-ray does not show intrathoracic injury. This time she will get treated with anti-inflammatory and muscle relaxer.    Charlesetta Shanks, MD 01/11/15 [phone]

## 2015-01-11 NOTE — Discharge Instructions (Signed)
Blunt Chest Trauma °Blunt chest trauma is an injury caused by a blow to the chest. These chest injuries can be very painful. Blunt chest trauma often results in bruised or broken (fractured) ribs. Most cases of bruised and fractured ribs from blunt chest traumas get better after 1 to 3 weeks of rest and pain medicine. Often, the soft tissue in the chest wall is also injured, causing pain and bruising. Internal organs, such as the heart and lungs, may also be injured. Blunt chest trauma can lead to serious medical problems. This injury requires immediate medical care. °CAUSES  °· Motor vehicle collisions. °· Falls. °· Physical violence. °· Sports injuries. °SYMPTOMS  °· Chest pain. The pain may be worse when you move or breathe deeply. °· Shortness of breath. °· Lightheadedness. °· Bruising. °· Tenderness. °· Swelling. °DIAGNOSIS  °Your caregiver will do a physical exam. X-rays may be taken to look for fractures. However, minor rib fractures may not show up on X-rays until a few days after the injury. If a more serious injury is suspected, further imaging tests may be done. This may include ultrasounds, computed tomography (CT) scans, or magnetic resonance imaging (MRI). °TREATMENT  °Treatment depends on the severity of your injury. Your caregiver may prescribe pain medicines and deep breathing exercises. °HOME CARE INSTRUCTIONS °· Limit your activities until you can move around without much pain. °· Do not do any strenuous work until your injury is healed. °· Put ice on the injured area. °¨ Put ice in a plastic bag. °¨ Place a towel between your skin and the bag. °¨ Leave the ice on for 15-20 minutes, 03-04 times a day. °· You may wear a rib belt as directed by your caregiver to reduce pain. °· Practice deep breathing as directed by your caregiver to keep your lungs clear. °· Only take over-the-counter or prescription medicines for pain, fever, or discomfort as directed by your caregiver. °SEEK IMMEDIATE MEDICAL  CARE IF:  °· You have increasing pain or shortness of breath. °· You cough up blood. °· You have nausea, vomiting, or abdominal pain. °· You have a fever. °· You feel dizzy, weak, or you faint. °MAKE SURE YOU: °· Understand these instructions. °· Will watch your condition. °· Will get help right away if you are not doing well or get worse. °  °This information is not intended to replace advice given to you by your health care provider. Make sure you discuss any questions you have with your health care provider. °  °Document Released: 02/17/2004 Document Revised: 01/30/2014 Document Reviewed: 07/08/2014 °[employer] Interactive Patient Education ©2016 [employer] ° °

## 2015-02-17 ENCOUNTER — Other Ambulatory Visit: Payer: Self-pay | Admitting: Nurse Practitioner

## 2015-02-17 DIAGNOSIS — Z1231 Encounter for screening mammogram for malignant neoplasm of breast: Secondary | ICD-10-CM

## 2015-02-26 ENCOUNTER — Ambulatory Visit: Payer: Self-pay

## 2015-03-17 ENCOUNTER — Ambulatory Visit (INDEPENDENT_AMBULATORY_CARE_PROVIDER_SITE_OTHER): Payer: Medicare Other | Admitting: Neurology

## 2015-03-17 ENCOUNTER — Encounter: Payer: Self-pay | Admitting: Neurology

## 2015-03-17 VITALS — BP 130/98 | HR 76 | Resp 16 | Ht 68.0 in | Wt 170.0 lb

## 2015-03-17 DIAGNOSIS — G2581 Restless legs syndrome: Secondary | ICD-10-CM

## 2015-03-17 DIAGNOSIS — Z720 Tobacco use: Secondary | ICD-10-CM

## 2015-03-17 DIAGNOSIS — F17200 Nicotine dependence, unspecified, uncomplicated: Secondary | ICD-10-CM

## 2015-03-17 DIAGNOSIS — G4733 Obstructive sleep apnea (adult) (pediatric): Secondary | ICD-10-CM

## 2015-03-17 DIAGNOSIS — R634 Abnormal weight loss: Secondary | ICD-10-CM

## 2015-03-17 DIAGNOSIS — G4750 Parasomnia, unspecified: Secondary | ICD-10-CM | POA: Diagnosis not present

## 2015-03-17 NOTE — Progress Notes (Signed)
Subjective:    Patient ID: Judy Bailey is a 40 y.o. female.  HPI     Judy Age, MD, PhD [facility] [address] [place], [place] [address]  Dear Judy Bailey,   I saw your patient, Judy Bailey, upon your kind request in my neurologic clinic today for initial consultation of her sleep disorder, in particular, reevaluation of her prior diagnosis of OSA. The patient is unaccompanied today. As you know, Judy Bailey is a 40 year old right-handed woman with an underlying medical history of hyperlipidemia, hypertension, palpitations, B12 deficiency, restless leg syndrome, insomnia, depression, anxiety, and mood disorder, as well as obesity, who was previously diagnosed with obstructive sleep apnea and placed on CPAP therapy. Prior sleep test results are not available for my review today. She reports a sleep study about 10 or 12 years ago.  She reports a significant weight since her sleep study many years ago. It sounds like she had a diagnostic sleep study and then was placed on AutoPap. She has a family history of obstructive sleep apnea in her father and grandparents. She lives with her son and her mother, she has been told that she has apneic pauses while asleep. She also reports sleepwalking, sleep eating, and difficulty with sleep onset and sleep maintenance. Of note, she is on multiple psychotropic medications including Ambien 20 mg each night, prazosin 5 mg each night, lithium long-acting 500 mg in the morning, and immediate release 300 mg at night, she is on Adderall XR 30 mg in the morning and immediate release 30 mg at noon. She is on Latuda 80 mg daily, she is on Xanax 2 mg 3 times a day, she is on Inderal LA generic 120 mg once daily. She was using CPAP but her machine broke and she has not used it in several months. She was using a fullface mask. I reviewed your office note from 02/09/15, which you kindly included. She has reduced her caffeine  intake. She does not keep his sleep schedule. She estimates that she gets about 3-4 hours of sleep on an average night but does not go to bed at a certain time, sometimes in the early morning hours. She sees Judy Bailey in psychiatry. She smokes about half pack per day. She does not typically drink alcohol.  Her Past Medical History Is Significant For: Past Medical History  Diagnosis Date  . Other and unspecified hyperlipidemia   . Unspecified essential hypertension   . Unspecified sleep apnea   . Backache, unspecified   . Other nonspecific finding on examination of urine   . Leukorrhea, not specified as infective   . Other symptoms involving digestive system(787.99)   . Absence of menstruation   . Body mass index 30.0-30.9, adult   . Tobacco use disorder   . Palpitations   . Dyspepsia and other specified disorders of function of stomach   . Trichomonal vulvovaginitis   . Restless legs syndrome (RLS)   . Insomnia, unspecified   . Depressive disorder, not elsewhere classified   . Attention deficit disorder without mention of hyperactivity   . Agoraphobia with panic disorder   . Disturbance of skin sensation   . Ovarian cancer (Judy Bailey)   . Anxiety   . Bipolar disorder ([place])   . Night terrors   . PTSD (post-traumatic stress disorder)   . Borderline personality disorder   . Diabetes mellitus without complication [facility])     Her Past Surgical History Is Significant For: Past Surgical  History  Procedure Laterality Date  . Tonsillectomy    . Tympanic membrane  2009  . Knee cartilage surgery      right    Her Family History Is Significant For: Family History  Problem Relation Bailey of Onset  . Prostate cancer Maternal Grandfather   . Liver disease Maternal Grandfather   . Breast cancer Paternal Grandmother   . Diabetes Paternal Grandmother   . Bipolar disorder Paternal Grandmother   . Alzheimer's disease Paternal Grandfather   . Parkinson's disease Paternal Grandfather   .  Hypertension Brother   . Hypertension Father   . Diabetes Father   . Heart disease Father   . Depression Father   . Colon polyps Father   . Other Father     mad cow disease  . Bipolar disorder Sister   . Colon polyps Brother   . Colon polyps Mother   . Kidney disease Mother   . Clotting disorder Maternal Grandmother   . Irritable bowel syndrome Sister   . Irritable bowel syndrome Paternal Aunt   . Bipolar disorder Paternal Aunt   . Bipolar disorder Cousin     Her Social History Is Significant For: Social History   Social History  . Marital Status: Divorced    Spouse Name: N/A  . Number of Children: 1  . Years of Education: 41   Occupational History  . Disability     Social History Main Topics  . Smoking status: Current Every Day Smoker -- 1.00 packs/day for 22 years    Types: Cigarettes  . Smokeless tobacco: Never Used  . Alcohol Use: No  . Drug Use: No  . Sexual Activity:    Partners: Male    Birth Control/ Protection: Abstinence   Other Topics Concern  . None   Social History Narrative   Pt lives at home with mother and son.   Caffeine Use: 2 sodas weekly    Her Allergies Are:  No Known Allergies:   Her Current Medications Are:  Outpatient Encounter Prescriptions as of 03/17/2015  Medication Sig  . alprazolam (XANAX) 2 MG tablet Take 2 mg by mouth 3 (three) times daily as needed for sleep.   Marland Kitchen amphetamine-dextroamphetamine (ADDERALL XR) 30 MG 24 hr capsule Take 30 mg by mouth daily.   Marland Kitchen amphetamine-dextroamphetamine (ADDERALL) 30 MG tablet Take 30 mg by mouth 2 (two) times daily.   Marland Kitchen desvenlafaxine (PRISTIQ) 100 MG 24 hr tablet Take 1 tablet (100 mg total) by mouth daily.  Marland Kitchen lithium carbonate (ESKALITH) 450 MG CR tablet   . medroxyPROGESTERone (DEPO-PROVERA) 150 MG/ML injection Inject 150 mg into the muscle every 3 (three) months.  Marland Kitchen omeprazole (PRILOSEC) 20 MG capsule Take 20 mg by mouth daily.  . propranolol ER (INDERAL LA) 120 MG 24 hr capsule Take  120 mg by mouth daily.  Marland Kitchen zolpidem (AMBIEN) 10 MG tablet Take 10 mg by mouth at bedtime as needed for sleep.   No facility-administered encounter medications on file as of 03/17/2015.  :  Review of Systems:  Out of a complete 14 point review of systems, all are reviewed and negative with the exception of these symptoms as listed below:   Review of Systems  Neurological:       Had sleep study about 10 years ago. Prescribed CPAP. CPAP machine broke recently and has not used it in past 3-6 months.   Trouble falling asleep, wakes up during the night, snoring, witnessed apnea, wakes up feeling tired, daytime tiredness. Restless  legs.    Epworth Sleepiness Scale 0= would never doze 1= slight chance of dozing 2= moderate chance of dozing 3= high chance of dozing  Sitting and reading:0 Watching TV:3 Sitting inactive in a public place (ex. Theater or meeting):2 As a passenger in a car for an hour without a break:2 Lying down to rest in the afternoon:2 Sitting and talking to someone:0 Sitting quietly after lunch (no alcohol):1 In a car, while stopped in traffic:1 Total:11   Objective:  Neurologic Exam  Physical Exam Physical Examination:   Filed Vitals:   03/17/15 1305  BP: 130/98  Pulse: 76  Resp: 16    General Examination: The patient is a very pleasant 40 y.o. female in no acute distress. She appears well-developed and well-nourished and well groomed.   HEENT: Normocephalic, atraumatic, pupils are equal, round and reactive to light and accommodation. Funduscopic exam is normal with sharp disc margins noted. Extraocular tracking is good without limitation to gaze excursion or nystagmus noted. Normal smooth pursuit is noted. Hearing is grossly intact. Tympanic membranes are clear bilaterally. Face is symmetric with normal facial animation and normal facial sensation, perhaps mild decrease in pinprick sensation in the midface bilaterally. Speech is clear with no dysarthria noted.  There is no hypophonia. There is no lip, neck/head, jaw or voice tremor. Neck is supple with full range of passive and active motion. There are no carotid bruits on auscultation. Oropharynx exam reveals: moderate mouth dryness, adequate dental hygiene and mild airway crowding, due to smaller airway entry. Tonsils absent. Mallampati is class II. Neck circumference is a slender 13 and 18 inches.  Chest: Clear to auscultation without wheezing, rhonchi or crackles noted.  Heart: S1+S2+0, regular and normal without murmurs, rubs or gallops noted.   Abdomen: Soft, non-tender and non-distended with normal bowel sounds appreciated on auscultation.  Extremities: There is no pitting edema in the distal lower extremities bilaterally. Pedal pulses are intact.  Skin: Warm and dry without trophic changes noted. There are no varicose veins.  Musculoskeletal: exam reveals no obvious joint deformities, tenderness or joint swelling or erythema.   Neurologically:  Mental status: The patient is awake, alert and oriented in all 4 spheres. Her immediate and remote memory, attention, language skills and fund of knowledge are appropriate. There is no evidence of aphasia, agnosia, apraxia or anomia. Speech is clear with normal prosody and enunciation. Thought process is linear. Mood is normal and affect is normal.  Cranial nerves II - XII are as described above under HEENT exam. In addition: shoulder shrug is normal with equal shoulder height noted. Motor exam: Normal bulk, strength and tone is noted. There is no drift, tremor or rebound. Romberg is negative. Reflexes are 2+ throughout. Babinski: Toes are flexor bilaterally. Fine motor skills and coordination: intact with normal finger taps, normal hand movements, normal rapid alternating patting, normal foot taps and normal foot agility.  Cerebellar testing: No dysmetria or intention tremor on finger to nose testing. Heel to shin is unremarkable bilaterally. There is no  truncal or gait ataxia.  Sensory exam: intact to light touch, pinprick, vibration, temperature sense in the upper and lower, with the exception of mildly decreased pinprick sensation in her right hand and forearm.  Gait, station and balance: She stands easily. No veering to one side is noted. No leaning to one side is noted. Posture is Bailey-appropriate and stance is narrow based. Gait shows normal stride length and normal pace. No problems turning are noted. She turns en bloc. Tandem  walk is unremarkable.   Assessment and Plan:   In summary, CHARMELLE KING is a very pleasant 40 y.o.-year old female with an underlying medical history of hyperlipidemia, hypertension, palpitations, B12 deficiency, restless leg syndrome, insomnia, depression, anxiety, and mood disorder, as well as obesity, who was previously diagnosed with obstructive sleep apnea and placed on PAP therapy, possibly autoPAP.  She reports difficulty with sleep initiation and sleep maintenance as well as parasomnias. These issues are managed by her psychiatrist. She is advised that I would be happy to recheck on her obstructive sleep apnea. In light of her significant weight loss she may have had an improvement in her sleep disordered breathing she is reassured. I do worry about her polypharmacy and multiple psychotropic medications. However, she is in close follow-up with psychiatry as I understand.  I had a long chat with the patient about my findings and the diagnosis of OSA, its prognosis and treatment options. We talked about medical treatments, surgical interventions and non-pharmacological approaches. I explained in particular the risks and ramifications of untreated moderate to severe OSA, especially with respect to developing cardiovascular disease down the Road, including congestive heart failure, difficult to treat hypertension, cardiac arrhythmias, or stroke. Even type 2 diabetes has, in part, been linked to untreated OSA. Symptoms of  untreated OSA include daytime sleepiness, memory problems, mood irritability and mood disorder such as depression and anxiety, lack of energy, as well as recurrent headaches, especially morning headaches. We talked about smoking cessation and trying to maintain a healthy lifestyle in general, as well as the importance of weight control. I encouraged the patient to eat healthy, exercise daily and keep well hydrated, to keep a scheduled bedtime and wake time routine, to not skip any meals and eat healthy snacks in between meals. I advised the patient not to drive when feeling sleepy. I recommended the following at this time: sleep study with potential positive airway pressure titration. (We will score hypopneas at 4% and split the sleep study into diagnostic and treatment portion, if the estimated. 2 hour AHI is >15/h).   I explained the sleep test procedure to the patient and also outlined possible surgical and non-surgical treatment options of OSA, including the use of a custom-made dental device (which would require a referral to a specialist dentist or oral surgeon), upper airway surgical options, such as pillar implants, radiofrequency surgery, tongue base surgery, and UPPP (which would involve a referral to an ENT surgeon). Rarely, jaw surgery such as mandibular advancement may be considered.  I also explained the CPAP treatment option to the patient, who indicated that she would be willing to use CPAP again, if the need arises.   Thank you very much for allowing me to participate in the care of this nice patient. If I can be of any further assistance to you please do not hesitate to call me at [phone].  Sincerely,   Judy Age, MD, PhD

## 2015-03-17 NOTE — Patient Instructions (Signed)

## 2015-03-25 ENCOUNTER — Inpatient Hospital Stay: Admission: RE | Admit: 2015-03-25 | Payer: Self-pay | Source: Ambulatory Visit

## 2015-03-28 ENCOUNTER — Ambulatory Visit (INDEPENDENT_AMBULATORY_CARE_PROVIDER_SITE_OTHER): Payer: Medicare Other | Admitting: Neurology

## 2015-03-28 DIAGNOSIS — G479 Sleep disorder, unspecified: Secondary | ICD-10-CM

## 2015-03-28 DIAGNOSIS — G4733 Obstructive sleep apnea (adult) (pediatric): Secondary | ICD-10-CM

## 2015-03-28 DIAGNOSIS — R0902 Hypoxemia: Secondary | ICD-10-CM

## 2015-03-28 DIAGNOSIS — G4720 Circadian rhythm sleep disorder, unspecified type: Secondary | ICD-10-CM

## 2015-03-29 ENCOUNTER — Telehealth: Payer: Self-pay | Admitting: Neurology

## 2015-03-29 DIAGNOSIS — G4720 Circadian rhythm sleep disorder, unspecified type: Secondary | ICD-10-CM

## 2015-03-29 DIAGNOSIS — R0902 Hypoxemia: Secondary | ICD-10-CM

## 2015-03-29 DIAGNOSIS — G479 Sleep disorder, unspecified: Secondary | ICD-10-CM

## 2015-03-29 DIAGNOSIS — G4733 Obstructive sleep apnea (adult) (pediatric): Secondary | ICD-10-CM

## 2015-03-29 NOTE — Telephone Encounter (Signed)
LM to call back for results

## 2015-03-29 NOTE — Telephone Encounter (Signed)
Beverlee Nims referral has been sent for Pulmonology.

## 2015-03-29 NOTE — Sleep Study (Signed)
Please see the scanned sleep study interpretation located in the Procedure tab within the Chart Review section. 

## 2015-03-29 NOTE — Telephone Encounter (Signed)
I called patient back (number below is wrong). She is aware of results and recommendations and is willing to start treatment. I have sent referral to [facility]. Hinton Dyer has sent in [facility] referral, patient aware. I will send copy of study to PCP. I will send patient a letter reminding her to make f/u appt and stress importance of compliance.

## 2015-03-29 NOTE — Telephone Encounter (Signed)
Diana:  Patient referred by Ms. Jones, seen by me on 03/17/15, split study on 03/28/15, Ins: MCR/MCD.  Beverlee Nims: Please call and notify patient that the recent sleep study confirmed the diagnosis of significant OSA with SEVERE drops in O2 and low average O2 which required oxygen supplementation. She did reasonably well with CPAP, but required O2 for ongoing low average oxygen sats. I would like for her to see a pulmonologist and STOP SMOKING.  I would like start the patient on CPAP therapy at home by prescribing a machine for home use. I placed the order in the chart, [place]. The patient will need a follow up appointment with me in 8 to 10 weeks post set up that has to be scheduled; please go ahead and schedule while you have the patient on the phone and make sure patient understands the importance of keeping this window for the FU appointment, as it is often an insurance requirement and failing to adhere to this may result in losing coverage for sleep apnea treatment.  Please re-enforce the importance of compliance with treatment and the need for [place] to monitor compliance data - again an insurance requirement and good feedback for the patient as far as how they are doing.  Also remind patient, that any upcoming CPAP machine or mask issues, should be first addressed with the DME company. Please ask if patient has a preference regarding DME company.  Please facilitate pulm referral Hinton Dyer), and Beverlee Nims: arrange for CPAP set up at home through a DME company of patient's choice - once you have spoken to the patient - and faxed/routed report to PCP and referring MD (if other than PCP), you can close this encounter, thanks,   Star Age, MD, PhD [facility] ([place])

## 2015-03-29 NOTE — Telephone Encounter (Signed)
Patient called back and would like a return call please [phone].  Thanks!

## 2015-03-30 ENCOUNTER — Telehealth: Payer: Self-pay

## 2015-03-30 NOTE — Telephone Encounter (Signed)
I sent new orders for CPAP to [facility] for patient. This is the email I received from [place] at [facility]:   [place], RN            Just an [place].Marland KitchenMarland Kitchen I sent this through this morning and have since been advised that she is in collections with [place] and must take care of her balance prior to providing any new services. She has been in collections since 2015. Our office has made many attempts to contact the patient to resolve with no avail. We will keep trying to reach her.   Thanks

## 2015-04-05 NOTE — Telephone Encounter (Signed)
Pt called requesting where DME was sent. I gave her [facility] # and relayed to her to speak with Gracy Racer. I did not relay any information regarding prior balance.

## 2015-05-04 ENCOUNTER — Ambulatory Visit
Admission: RE | Admit: 2015-05-04 | Discharge: 2015-05-04 | Disposition: A | Discharge status: Home / Self Care | Payer: Medicare Other | Source: Ambulatory Visit | Attending: Nurse Practitioner | Admitting: Nurse Practitioner

## 2015-05-04 DIAGNOSIS — Z1231 Encounter for screening mammogram for malignant neoplasm of breast: Secondary | ICD-10-CM

## 2015-05-05 ENCOUNTER — Other Ambulatory Visit: Payer: Self-pay | Admitting: Nurse Practitioner

## 2015-05-05 DIAGNOSIS — R928 Other abnormal and inconclusive findings on diagnostic imaging of breast: Secondary | ICD-10-CM

## 2015-05-12 ENCOUNTER — Ambulatory Visit
Admission: RE | Admit: 2015-05-12 | Discharge: 2015-05-12 | Disposition: A | Discharge status: Home / Self Care | Payer: Medicare Other | Source: Ambulatory Visit | Attending: Nurse Practitioner | Admitting: Nurse Practitioner

## 2015-05-12 DIAGNOSIS — R928 Other abnormal and inconclusive findings on diagnostic imaging of breast: Secondary | ICD-10-CM

## 2015-05-18 ENCOUNTER — Telehealth: Payer: Self-pay | Admitting: Pulmonary Disease

## 2015-05-18 ENCOUNTER — Ambulatory Visit (INDEPENDENT_AMBULATORY_CARE_PROVIDER_SITE_OTHER)
Admission: RE | Admit: 2015-05-18 | Discharge: 2015-05-18 | Disposition: A | Discharge status: Home / Self Care | Payer: Medicare Other | Source: Ambulatory Visit | Attending: Pulmonary Disease | Admitting: Pulmonary Disease

## 2015-05-18 ENCOUNTER — Encounter: Payer: Self-pay | Admitting: Pulmonary Disease

## 2015-05-18 ENCOUNTER — Ambulatory Visit (INDEPENDENT_AMBULATORY_CARE_PROVIDER_SITE_OTHER): Payer: Medicare Other | Admitting: Pulmonary Disease

## 2015-05-18 VITALS — BP 108/62 | HR 95 | Ht 68.0 in | Wt 173.0 lb

## 2015-05-18 DIAGNOSIS — G4733 Obstructive sleep apnea (adult) (pediatric): Secondary | ICD-10-CM

## 2015-05-18 DIAGNOSIS — Z720 Tobacco use: Secondary | ICD-10-CM | POA: Insufficient documentation

## 2015-05-18 DIAGNOSIS — J449 Chronic obstructive pulmonary disease, unspecified: Secondary | ICD-10-CM

## 2015-05-18 DIAGNOSIS — R0609 Other forms of dyspnea: Secondary | ICD-10-CM

## 2015-05-18 DIAGNOSIS — R0902 Hypoxemia: Secondary | ICD-10-CM | POA: Insufficient documentation

## 2015-05-18 NOTE — Telephone Encounter (Signed)
Melissa from [facility] called and states she received notification of a pressure change. She would like a new order for oxygen. She can be reached at [phone].-Thanks -pm

## 2015-05-18 NOTE — Telephone Encounter (Signed)
   Judy Bailey --  This is a reminder for both of [place]:  1. pls ask DME if pt has o2 plugged into cpap.  If not, pls order O2 1L with cpap per sleep study report. 2. If we can get a 2 week DL. 3. If we can dec pressure to cpap 10 cm h2O and get a 1 month DL on her or have her on airview/.  Thanks

## 2015-05-18 NOTE — Progress Notes (Signed)
Subjective:    Patient ID: Judy Bailey, female    DOB: 04/12/1975, 40 y.o.   MRN: [id number]  HPI   This is the case of Judy Bailey, 40 y.o. Female, who was referred by Eldridge Abrahams and Dr. Star Age  in consultation regarding hypoxemia and OSA.   As you very well know, patient was dxed with OSA in 03/2015. AHI 20. Started on CPAP 13 cm  2 weeks ago. She feels pressure is too much. She ends up removing machine middle of the night. She uses O2 1L into machine.   15 PY smoking history, smokes 1/2 PPD.  Not been dxed with asthma or COPD.  Has chronic exertional dyspnea. Can do ADLs.   Has heart issues 2/2 to HTN.   Has anxiety issues as well.   Has psych issues -- PTSD, Bipolar. Sees therapist weekly.      Review of Systems  Constitutional: Negative.  Negative for fever and unexpected weight change.  HENT: Negative.  Negative for congestion, dental problem, ear pain, nosebleeds, postnasal drip, rhinorrhea, sinus pressure, sneezing, sore throat and trouble swallowing.   Eyes: Positive for redness and itching.  Respiratory: Positive for shortness of breath. Negative for cough, chest tightness and wheezing.   Cardiovascular: Positive for palpitations and leg swelling.  Gastrointestinal: Negative.  Negative for nausea and vomiting.  Endocrine: Positive for heat intolerance.  Genitourinary: Negative.  Negative for dysuria.  Musculoskeletal: Positive for arthralgias. Negative for joint swelling.  Skin: Negative.  Negative for rash.  Allergic/Immunologic: Negative.   Neurological: Negative.  Negative for headaches.  Hematological: Negative.  Does not bruise/bleed easily.  Psychiatric/Behavioral: Negative.  Negative for dysphoric mood. The patient is not nervous/anxious.     Past Medical History  Diagnosis Date  . Other and unspecified hyperlipidemia   . Unspecified essential hypertension   . Unspecified sleep apnea   . Backache, unspecified   . Other nonspecific finding on  examination of urine   . Leukorrhea, not specified as infective   . Other symptoms involving digestive system(787.99)   . Absence of menstruation   . Body mass index 30.0-30.9, adult   . Tobacco use disorder   . Palpitations   . Dyspepsia and other specified disorders of function of stomach   . Trichomonal vulvovaginitis   . Restless legs syndrome (RLS)   . Insomnia, unspecified   . Depressive disorder, not elsewhere classified   . Attention deficit disorder without mention of hyperactivity   . Agoraphobia with panic disorder   . Disturbance of skin sensation   . Ovarian cancer ([place])   . Anxiety   . Bipolar disorder ([place])   . Night terrors   . PTSD (post-traumatic stress disorder)   . Borderline personality disorder   . Diabetes mellitus without complication (HCC)    (-) DVT Had uterine CA > had surgery  Family History  Problem Relation Age of Onset  . Prostate cancer Maternal Grandfather   . Liver disease Maternal Grandfather   . Breast cancer Paternal Grandmother   . Diabetes Paternal Grandmother   . Bipolar disorder Paternal Grandmother   . Alzheimer's disease Paternal Grandfather   . Parkinson's disease Paternal Grandfather   . Hypertension Brother   . Hypertension Father   . Diabetes Father   . Heart disease Father   . Depression Father   . Colon polyps Father   . Other Father     mad cow disease  . Bipolar disorder Sister   .  Colon polyps Brother   . Colon polyps Mother   . Kidney disease Mother   . Clotting disorder Maternal Grandmother   . Irritable bowel syndrome Sister   . Irritable bowel syndrome Paternal Aunt   . Bipolar disorder Paternal Aunt   . Bipolar disorder Cousin      Past Surgical History  Procedure Laterality Date  . Tonsillectomy    . Tympanic membrane  2009  . Knee cartilage surgery      right    Social History   Social History  . Marital Status: Divorced    Spouse Name: N/A  . Number of Children: 1  . Years of Education: 64     Occupational History  . Disability     Social History Main Topics  . Smoking status: Current Every Day Smoker -- 1.00 packs/day for 22 years    Types: Cigarettes  . Smokeless tobacco: Never Used  . Alcohol Use: No  . Drug Use: No  . Sexual Activity:    Partners: Male    Birth Control/ Protection: Abstinence   Other Topics Concern  . Not on file   Social History Narrative   Pt lives at home with mother and son.   Caffeine Use: 2 sodas weekly   Lives with parent and son.   On disability 2/2 Bipolar and PTSD.   No Known Allergies   Outpatient Prescriptions Prior to Visit  Medication Sig Dispense Refill  . alprazolam (XANAX) 2 MG tablet Take 2 mg by mouth 3 (three) times daily as needed for sleep.     Marland Kitchen amphetamine-dextroamphetamine (ADDERALL XR) 30 MG 24 hr capsule Take 30 mg by mouth daily.     Marland Kitchen amphetamine-dextroamphetamine (ADDERALL) 30 MG tablet Take 30 mg by mouth daily.     . cyanocobalamin (,VITAMIN B-12,) 1000 MCG/ML injection Inject into the muscle.    Marland Kitchen desvenlafaxine (PRISTIQ) 100 MG 24 hr tablet Take 1 tablet (100 mg total) by mouth daily. 30 tablet 0  . lurasidone (LATUDA) 80 MG TABS tablet Take 80 mg by mouth daily.    Marland Kitchen omeprazole (PRILOSEC) 20 MG capsule Take 20 mg by mouth daily.    . prazosin (MINIPRESS) 5 MG capsule Take 5 mg by mouth at bedtime.    . propranolol ER (INDERAL LA) 120 MG 24 hr capsule Take 120 mg by mouth daily.    Marland Kitchen zolpidem (AMBIEN) 10 MG tablet Take 20 mg by mouth at bedtime as needed for sleep.     Marland Kitchen lithium carbonate (ESKALITH) 450 MG CR tablet 500mg XR in am and 300mg  at noon    . medroxyPROGESTERone (DEPO-PROVERA) 150 MG/ML injection Inject 150 mg into the muscle every 3 (three) months.     No facility-administered medications prior to visit.   Meds ordered this encounter  Medications  . lithium 300 MG tablet    Sig: Take 1 tablet by mouth as directed.    Refill:  0  . lithium carbonate (ESKALITH) 450 MG CR tablet    Sig: Take  450 mg by mouth every morning.  . Prenatal MV-Min-Fe Fum-FA-DHA (PRENATAL 1 PO)    Sig: Take by mouth.           Objective:   Physical Exam   Vitals:  Filed Vitals:   05/18/15 1147  BP: 108/62  Pulse: 95  Height: 5\' 8"  (1.727 m)  Weight: 173 lb (78.472 kg)  SpO2: 98%    Constitutional/General:  Pleasant, well-nourished, well-developed, not in any distress,  Comfortably  seating.  Well kempt  Body mass index is 26.31 kg/(m^2). Wt Readings from Last 3 Encounters:  05/18/15 173 lb (78.472 kg)  03/17/15 170 lb (77.111 kg)  08/04/13 194 lb (87.998 kg)    Neck circumference:   HEENT: Pupils equal and reactive to light and accommodation. Anicteric sclerae. Normal nasal mucosa.   No oral  lesions,  mouth clear,  oropharynx clear, no postnasal drip. (-) Oral thrush. No dental caries.  Airway - Mallampati class III  Neck: No masses. Midline trachea. No JVD, (-) LAD. (-) bruits appreciated.  Respiratory/Chest: Grossly normal chest. (-) deformity. (-) Accessory muscle use.  Symmetric expansion. (-) Tenderness on palpation.  Resonant on percussion.  Diminished BS on both lower lung zones. (-) wheezing, crackles, rhonchi (-) egophony  Cardiovascular: Regular rate and  rhythm, heart sounds normal, no murmur or gallops, no peripheral edema  Gastrointestinal:  Normal bowel sounds. Soft, non-tender. No hepatosplenomegaly.  (-) masses.   Musculoskeletal:  Normal muscle tone. Normal gait.   Extremities: Grossly normal. (-) clubbing, cyanosis.  (-) edema  Skin: (-) rash,lesions seen.   Neurological/Psychiatric : alert, oriented to time, place, person. Normal mood and affect           Assessment & Plan:  Obstructive sleep apnea Pt with moderate OSA with sig o2 desatn. PSG (03/28/15) AHI 20, optimal on cpap 11 but needed 1 L o2.  Neuro ordered cpap. She feels pressure is too much.  Pt states she does not have o2 plugged into cpap. Pt has bipolar, anxiety, PTSD but  she denies mask issues.   Plan : 1. Dec cpap to 10 cm h2O. Pt to call if pressure will be too much still. 2. DL up to now and next 2 weeks. 3. Need to call DME re: o2. Pt does not have o2.  4. May ned ONO on cpap if no o2.  5. She needs a F/U on her cpap in 1 month per medicare. Pt told me to take care of her cpap.    Exertional dyspnea 15PY smoking, smokes 1/2 PPD. Needed 1 L with cpap. Gets SOB with too much adls. Not too bad. Likely mild copd.  Plan : 1. Pft, abg, CXR. 2. Will need alpha one if with copd. 3. Holding off on MDI as she is not too symptomatic. ? Trial with spiriva on f/u.   Tobacco user 40 PY, smokes 1/PPD Has psyche issues -- ptsd, bipolar, anxiety Counselled on smoking cessation.   Hypoxemia Pt on 1L o2 with cpap allegedly. Not sure if she has o2. Need to f/u.     Thank you very much for letting me participate in this patient's care. Please do not hesitate to give me a call if you have any questions or concerns regarding the treatment plan.   Patient will follow up with me in 2 mos.     Monica Becton, MD 05/18/2015   12:51 PM [facility] [place] Pager: [phone] Office: [phone], Fax: [phone]

## 2015-05-18 NOTE — Assessment & Plan Note (Signed)
Pt on 1L o2 with cpap allegedly. Not sure if she has o2. Need to f/u.

## 2015-05-18 NOTE — Telephone Encounter (Signed)
Spoke with Melissa/[facility] - There are a few things they need in order for patient to get O2. Patient hasTraditional Medicare which requires a few things in order to approve O2 :  (1) Will have to have a Chronic Respiratory diagnosis on file (2) Needed to have tried and failed alternate method -- example: COPD medication (inhalers, nebulizers, specialty meds, etc...) (3) Will need new qualifying sats on file (4) If all the above is done, will need new order to be placed for O2   Please advise Dr Corrie Dandy. Thanks.

## 2015-05-18 NOTE — Assessment & Plan Note (Signed)
70 PY, smokes 1/PPD Has psyche issues -- ptsd, bipolar, anxiety Counselled on smoking cessation.

## 2015-05-18 NOTE — Assessment & Plan Note (Addendum)
Pt with moderate OSA with sig o2 desatn. PSG (03/28/15) AHI 20, optimal on cpap 11 but needed 1 L o2.  Neuro ordered cpap. She feels pressure is too much.  Pt states she does not have o2 plugged into cpap. Pt has bipolar, anxiety, PTSD but she denies mask issues.   Plan : 1. Dec cpap to 10 cm h2O. Pt to call if pressure will be too much still. 2. DL up to now and next 2 weeks. 3. Need to call DME re: o2. Pt does not have o2.  4. May ned ONO on cpap if no o2.  5. She needs a F/U on her cpap in 1 month per medicare. Pt told me to take care of her cpap.

## 2015-05-18 NOTE — Patient Instructions (Signed)
1.  We will order breathing test, ABG, chest xray. 2. We will decrease your cpap pressure to 10 cm H20. 3. Call if with issues.   Return to clinic in 2 mos.

## 2015-05-18 NOTE — Assessment & Plan Note (Addendum)
15PY smoking, smokes 1/2 PPD. Needed 1 L with cpap. Gets SOB with too much adls. Not too bad. Likely mild copd.  Plan : 1. Pft, abg, CXR. 2. Will need alpha one if with copd. 3. Holding off on MDI as she is not too symptomatic. ? Trial with spiriva on f/u.

## 2015-05-19 NOTE — Telephone Encounter (Signed)
Ok.  As we discussed, we will wait on the 1 month DL while on cpap of 10 cm h2o and go from there.  Her 1 month DL on cpap 13 was 0%, AHI 16. She was having issues with cpap.   AD

## 2015-05-19 NOTE — Telephone Encounter (Signed)
Per [facility] they did not see on original order from Dr Rexene Alberts that they were requesting nocturnal O2 routed through CPAP. It is now too late to use that order. For pt to have nocturnal O2 at this point the below outlined items would have to be obtained. The only other alternative would be a titration study that shows pt is on maximum treatment pressure and still hypoxic.   AD, please advise as to how you would like to proceed. Thanks!

## 2015-05-25 ENCOUNTER — Encounter (HOSPITAL_COMMUNITY): Payer: Self-pay

## 2015-05-27 ENCOUNTER — Ambulatory Visit (HOSPITAL_COMMUNITY)
Admission: RE | Admit: 2015-05-27 | Discharge: 2015-05-27 | Disposition: A | Discharge status: Home / Self Care | Payer: Medicare Other | Source: Ambulatory Visit | Attending: Pulmonary Disease | Admitting: Pulmonary Disease

## 2015-05-27 DIAGNOSIS — J449 Chronic obstructive pulmonary disease, unspecified: Secondary | ICD-10-CM | POA: Diagnosis present

## 2015-05-27 LAB — BLOOD GAS, ARTERIAL
Acid-Base Excess: 0.4 mmol/L (ref 0.0–2.0)
Bicarbonate: 24.4 mEq/L — ABNORMAL HIGH (ref 20.0–24.0)
DRAWN BY: 249101
FIO2: 0.21
O2 Saturation: 98.4 %
PATIENT TEMPERATURE: 98.6
PCO2 ART: 38.5 mmHg (ref 35.0–45.0)
PH ART: 7.418 (ref 7.350–7.450)
TCO2: 25.6 mmol/L (ref 0–100)
pO2, Arterial: 103 mmHg — ABNORMAL HIGH (ref 80.0–100.0)

## 2015-05-27 LAB — PULMONARY FUNCTION TEST
DL/VA % PRED: 60 %
DL/VA: 3.18 ml/min/mmHg/L
DLCO COR % PRED: 51 %
DLCO COR: 15.18 ml/min/mmHg
DLCO unc % pred: 50 %
DLCO unc: 14.99 ml/min/mmHg
FEF 25-75 POST: 3.33 L/s
FEF 25-75 Pre: 1.78 L/sec
FEF2575-%CHANGE-POST: 87 %
FEF2575-%PRED-PRE: 54 %
FEF2575-%Pred-Post: 101 %
FEV1-%Change-Post: 44 %
FEV1-%Pred-Post: 88 %
FEV1-%Pred-Pre: 61 %
FEV1-POST: 2.99 L
FEV1-Pre: 2.07 L
FEV1FVC-%CHANGE-POST: 40 %
FEV1FVC-%Pred-Pre: 74 %
FEV6-%Change-Post: 4 %
FEV6-%PRED-PRE: 80 %
FEV6-%Pred-Post: 84 %
FEV6-PRE: 3.3 L
FEV6-Post: 3.46 L
FEV6FVC-%Pred-Post: 102 %
FEV6FVC-%Pred-Pre: 102 %
FVC-%CHANGE-POST: 2 %
FVC-%PRED-POST: 83 %
FVC-%PRED-PRE: 81 %
FVC-POST: 3.5 L
FVC-PRE: 3.4 L
POST FEV1/FVC RATIO: 85 %
POST FEV6/FVC RATIO: 100 %
PRE FEV1/FVC RATIO: 61 %
Pre FEV6/FVC Ratio: 100 %
RV % pred: 77 %
RV: 1.43 L
TLC % PRED: 86 %
TLC: 4.89 L

## 2015-05-27 MED ORDER — ALBUTEROL SULFATE (2.5 MG/3ML) 0.083% IN NEBU
2.5000 mg | INHALATION_SOLUTION | Freq: Once | RESPIRATORY_TRACT | Status: AC
  Administered 2015-05-27: 2.5 mg via RESPIRATORY_TRACT

## 2015-05-28 ENCOUNTER — Other Ambulatory Visit: Payer: Self-pay | Admitting: Pulmonary Disease

## 2015-05-28 MED ORDER — TIOTROPIUM BROMIDE MONOHYDRATE 2.5 MCG/ACT IN AERS: 2.0000 | INHALATION_SPRAY | Freq: Every day | RESPIRATORY_TRACT | Status: DC

## 2015-05-31 ENCOUNTER — Encounter: Payer: Self-pay | Admitting: Pulmonary Disease

## 2015-06-16 ENCOUNTER — Telehealth: Payer: Self-pay | Admitting: Pulmonary Disease

## 2015-06-16 NOTE — Telephone Encounter (Signed)
Called spoke with pt. She states that she needs another sleep study. She states that she has spoke with [facility] and that since she has COPD and is being placed on oxygen that the insurance will not cover the oxygen at bedtime. I explained to her that I would call [facility] and discuss the situation. She voiced understanding and had no further questions.   Called [facility] and spoke with Izora Gala. She states that the computer system is down and that she will return my call once the computers are up and running properly again. Will await call back.

## 2015-06-17 NOTE — Telephone Encounter (Signed)
Spoke with Melissa  She states that she the pt is needing to have in lab CPAP titration study due to dx of COPD and being on o2  Please advise if okay to order this, thanks

## 2015-06-17 NOTE — Telephone Encounter (Signed)
Spoke with Melissa at [facility]. She is going to look into this for [place]. Will await her call back.

## 2015-06-17 NOTE — Telephone Encounter (Signed)
Melissa cb, states now that pt has copd dx, if patient comes in and does stat testing she does not have to do sleep study, because she will already qualify for O2, [phone]

## 2015-06-17 NOTE — Telephone Encounter (Signed)
Spoke with pt. She would rather do the walk test here at our office. Pt has been scheduled for tomorrow at 3pm. Lenna Sciara is aware of this.

## 2015-06-18 ENCOUNTER — Ambulatory Visit (INDEPENDENT_AMBULATORY_CARE_PROVIDER_SITE_OTHER): Payer: Medicare Other | Admitting: Pulmonary Disease

## 2015-06-18 DIAGNOSIS — G4733 Obstructive sleep apnea (adult) (pediatric): Secondary | ICD-10-CM

## 2015-06-23 ENCOUNTER — Other Ambulatory Visit: Payer: Self-pay | Admitting: Obstetrics

## 2015-06-26 ENCOUNTER — Ambulatory Visit (HOSPITAL_BASED_OUTPATIENT_CLINIC_OR_DEPARTMENT_OTHER): Payer: Medicare Other | Attending: Pulmonary Disease | Admitting: Pulmonary Disease

## 2015-06-26 VITALS — Ht 68.0 in | Wt 173.0 lb

## 2015-06-26 DIAGNOSIS — G4733 Obstructive sleep apnea (adult) (pediatric): Secondary | ICD-10-CM | POA: Diagnosis not present

## 2015-06-26 DIAGNOSIS — R0683 Snoring: Secondary | ICD-10-CM | POA: Diagnosis not present

## 2015-07-01 ENCOUNTER — Telehealth: Payer: Self-pay | Admitting: Pulmonary Disease

## 2015-07-01 DIAGNOSIS — G4733 Obstructive sleep apnea (adult) (pediatric): Secondary | ICD-10-CM | POA: Diagnosis not present

## 2015-07-01 NOTE — Procedures (Signed)
NAME: Judy Bailey DATE OF BIRTH:  13-Mar-1975 MEDICAL RECORD NUMBER [id number]  LOCATION: [facility]  PHYSICIAN: [place]  DATE OF STUDY: 06/26/2015   Patient Name: Judy Bailey, Judy Bailey Date: 06/26/2015   Gender: Female  D.O.B: [birth year]  Age (years): 40  Referring Provider: [place]   Height (inches): 84  Interpreting Physician: [place]   Weight (lbs): 170  RPSGT: Duwayne Heck   BMI: 25  MRN: [id number]  Neck Size: 13.50      CLINICAL INFORMATION  The patient is referred for a CPAP titration to treat sleep apnea. Date of NPSG, Split Night or HST:   SLEEP STUDY TECHNIQUE  As per the AASM Manual for the Scoring of Sleep and Associated Events v2.3 (April 2016) with a hypopnea requiring 4% desaturations.  The channels recorded and monitored were frontal, central and occipital EEG, electrooculogram (EOG), submentalis EMG (chin), nasal and oral airflow, thoracic and abdominal wall motion, anterior tibialis EMG, snore microphone, electrocardiogram, and pulse oximetry. Continuous positive airway pressure (CPAP) was initiated at the beginning of the study and titrated to treat sleep-disordered breathing.  MEDICATIONS  Medications taken by the patient : N/A. medication chart review was done. Medications administered by patient during sleep study : No sleep medicine administered.   TECHNICIAN COMMENTS  Comments added by technician: NONE  Comments added by scorer: N/A   RESPIRATORY PARAMETERS  Optimal PAP Pressure (cm): 10 AHI at Optimal Pressure (/hr): 0.0  Overall Minimal O2 (%): 84.00 Supine % at Optimal Pressure (%): 14  Minimal O2 at Optimal Pressure (%): 94.0     SLEEP ARCHITECTURE  The study was initiated at 10:45:22 PM and ended at 4:47:12 AM.  Sleep onset time was 9.0 minutes and the sleep efficiency was 79.0%. The total sleep time was 285.8 minutes.  The patient spent 6.65% of the night in stage N1  sleep, 78.66% in stage N2 sleep, 0.00% in stage N3 and 14.70% in REM.Stage REM latency was 167.5 minutes  Wake after sleep onset was 67.0. Alpha intrusion was absent. Supine sleep was 32.11%.  CARDIAC DATA  The 2 lead EKG demonstrated sinus rhythm. The mean heart rate was 82.64 beats per minute. Other EKG findings include: None.   LEG MOVEMENT DATA  The total Periodic Limb Movements of Sleep (PLMS) were 0. The PLMS index was 0.00. A PLMS index of <15 is considered normal in adults.  IMPRESSIONS  The adequate PAP pressure was 10 cm of water. Patient had 20 minutes of sleep with this setting and 11 minutes of REM sleep. With this setting, lowest o2 saturation was 92% No significant O2 desaturation noted during the study. Central sleep apnea was not noted during this titration (CAI = 0.0/h). Moderate oxygen desaturations were observed during this titration (min O2 = 84.00%). The patient snored with Soft snoring volume during this titration study. No cardiac abnormalities were observed during this study. Clinically significant periodic limb movements were not noted during this study. Arousals associated with PLMs were rare.   DIAGNOSIS  Obstructive Sleep Apnea (327.23 [G47.33 ICD-10]) No evidence for significant O2 desaturation during this study.   RECOMMENDATIONS  Continue CPAP therapy with 10 cm H2O with a Small size Fisher&Paykel Full Face Mask Simplus mask and heated humidification. No need for supplemental O2 with CPAP. Avoid alcohol, sedatives and other CNS depressants that may worsen sleep apnea and disrupt normal sleep architecture. Sleep hygiene should be reviewed  to assess factors that may improve sleep quality. Weight management and regular exercise should be initiated or continued. Follow up in the office as scheduled.  Monica Becton, MD 07/01/2015, 2:25 PM [facility] Pager [phone] After 3 pm or if no answer, call [phone]

## 2015-07-01 NOTE — Telephone Encounter (Signed)
   Judy Bailey -- pls tell pt that the cpap study did NOT show that she needed O2 with her cpap.   The best pressure for pt is cpap 10 cm H2O on room air.   pls tell DME to keep pt on 10 cm H2O and we will need a 1 month download on cpap of 10.  Thanks.  AD

## 2015-07-02 NOTE — Telephone Encounter (Signed)
LMTCB

## 2015-07-20 ENCOUNTER — Telehealth: Payer: Self-pay | Admitting: Pulmonary Disease

## 2015-07-20 NOTE — Telephone Encounter (Signed)
Patient notified of Dr. Corrie Dandy recommendations. Nothing further needed.

## 2015-07-20 NOTE — Telephone Encounter (Signed)
Patient notified of Dr. Corrie Dandy recommendations. See TE dated 07/01/15 Nothing further needed.

## 2015-07-20 NOTE — Telephone Encounter (Signed)
LMTCB

## 2015-07-29 ENCOUNTER — Ambulatory Visit: Payer: Self-pay | Admitting: Pulmonary Disease

## 2015-08-02 ENCOUNTER — Ambulatory Visit: Payer: Medicare Other | Admitting: Pulmonary Disease

## 2015-08-04 ENCOUNTER — Ambulatory Visit: Payer: Medicare Other | Admitting: Pulmonary Disease

## 2015-08-27 ENCOUNTER — Ambulatory Visit: Payer: Medicare Other | Admitting: Pulmonary Disease

## 2015-09-16 ENCOUNTER — Ambulatory Visit: Payer: Medicare Other | Admitting: Physical Therapy

## 2015-09-21 ENCOUNTER — Encounter: Payer: Self-pay | Admitting: Occupational Therapy

## 2015-09-21 ENCOUNTER — Ambulatory Visit: Payer: Medicare Other | Attending: Family Medicine | Admitting: Occupational Therapy

## 2015-09-21 DIAGNOSIS — R208 Other disturbances of skin sensation: Secondary | ICD-10-CM | POA: Insufficient documentation

## 2015-09-21 DIAGNOSIS — M6281 Muscle weakness (generalized): Secondary | ICD-10-CM | POA: Insufficient documentation

## 2015-09-21 DIAGNOSIS — M25641 Stiffness of right hand, not elsewhere classified: Secondary | ICD-10-CM | POA: Diagnosis present
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
## 2015-09-28 ENCOUNTER — Encounter: Payer: Self-pay | Admitting: Neurology

## 2015-09-28 ENCOUNTER — Ambulatory Visit (INDEPENDENT_AMBULATORY_CARE_PROVIDER_SITE_OTHER): Payer: Medicare Other | Admitting: Neurology

## 2015-09-28 ENCOUNTER — Ambulatory Visit: Payer: Medicare Other | Attending: Family Medicine | Admitting: Occupational Therapy

## 2015-09-28 VITALS — BP 108/58 | HR 70 | Ht 68.0 in | Wt 162.0 lb

## 2015-09-28 DIAGNOSIS — M6281 Muscle weakness (generalized): Secondary | ICD-10-CM

## 2015-09-28 DIAGNOSIS — M25641 Stiffness of right hand, not elsewhere classified: Secondary | ICD-10-CM

## 2015-09-28 DIAGNOSIS — M21331 Wrist drop, right wrist: Secondary | ICD-10-CM

## 2015-09-28 DIAGNOSIS — R208 Other disturbances of skin sensation: Secondary | ICD-10-CM

## 2015-09-28 NOTE — Progress Notes (Signed)
Subjective:    Patient ID: Judy Bailey is a 40 y.o. female.  HPI     Judy Age, MD, PhD [facility] [address], [address]O. [place], [place] [address]  Dear Dr. Luciana Bailey,   I saw your patient, Judy Bailey, upon your kind request in my neurologic clinic today for initial consultation of her right arm paresthesias. The patient is unaccompanied today. As you know, Judy Bailey is a 40 year old right-handed woman with an underlying medical history of hyperlipidemia, hypertension, palpitations, B12 deficiency, smoking, restless leg syndrome, insomnia, depression, anxiety, followed by psychiatry for mood disorder, obesity and obstructive sleep apnea who I have previously seen on 03/17/2015 for obstructive sleep apnea. She had a split-night sleep study on 03/28/2015 which not only confirmed the diagnosis of sleep apnea but she she needed oxygen supplementation. I made a referral to pulmonology. She has been seeing Dr. Murlean Bailey.  She is referred for a new problem of acute onset of right upper extremity numbness, tingling and weakness, mainly in the right wrist, with wrist drop noted. This started about 3-4 weeks ago, slightly improved since then, reports having more feeling. Started having similar symptoms on the left side about 3 days later, but lasted only one day, is better overall.  Reports some mid and lower back pain. Requested pain medication today and I explained to her that I do not manage her back pain and I do not manage chronic pain. Denies any weakness in her proximal upper extremities or biceps areas and no problems with elbow extension either side. Does not indicate falling asleep in an awkward sleep position. No other symptoms such as headache, slurring of speech, droopy face or lower extremity weakness reported.  She started OT and has been given wrist splints b/l. Is supposed to do exercises daily. She states that she still needs help with some of her daily  activities, mom has to help her. However, she was able to drive herself to the appointment today. She handles her cell phone quite well. She uses both the left and the right hand independently to handle her wrist splints and her cell phone today.  I reviewed your office note from 09/01/2015. You ordered a brain MRI without contrast. She had this on 09/07/2015 and I reviewed the report. Impression: Normal exam. No cause for the presenting symptoms is identified.  Of note, she has previously also seen my colleague Dr. Leta Bailey on 06/05/2012 for tremors and involuntary movements. Workup at the time included a brain MRI. She had a brain MRI without contrast on 07/01/2012: IMPRESSION:  Normal brain parenchyma. Acute left frontal and chronic maxillary sinusitis noted.  Previously:   03/17/2015: She was previously diagnosed with obstructive sleep apnea and placed on CPAP therapy. Prior sleep test results are not available for my review today. She reports a sleep study about 10 or 12 years ago.  She reports a significant weight since her sleep study many years ago. It sounds like she had a diagnostic sleep study and then was placed on AutoPap. She has a family history of obstructive sleep apnea in her father and grandparents. She lives with her son and her mother, she has been told that she has apneic pauses while asleep. She also reports sleepwalking, sleep eating, and difficulty with sleep onset and sleep maintenance. Of note, she is on multiple psychotropic medications including Ambien 20 mg each night, prazosin 5 mg each night, lithium long-acting 500 mg in the morning, and immediate release 300  mg at night, she is on Adderall XR 30 mg in the morning and immediate release 30 mg at noon. She is on Latuda 80 mg daily, she is on Xanax 2 mg 3 times a day, she is on Inderal LA generic 120 mg once daily. She was using CPAP but her machine broke and she has not used it in several months. She was using a fullface  mask. I reviewed your office note from 02/09/15, which you kindly included. She has reduced her caffeine intake. She does not keep his sleep schedule. She estimates that she gets about 3-4 hours of sleep on an average night but does not go to bed at a certain time, sometimes in the early morning hours. She sees Dr. Rosine Bailey in psychiatry. She smokes about half pack per day. She does not typically drink alcohol.  Her Past Medical History Is Significant For: Past Medical History:  Diagnosis Date  . Absence of menstruation   . Agoraphobia with panic disorder   . Anxiety   . Attention deficit disorder without mention of hyperactivity   . Backache, unspecified   . Bipolar disorder ([place])   . Body mass index 30.0-30.9, adult   . Borderline personality disorder   . Depressive disorder, not elsewhere classified   . Diabetes mellitus without complication ([place])   . Disturbance of skin sensation   . Dyspepsia and other specified disorders of function of stomach   . Insomnia, unspecified   . Leukorrhea, not specified as infective   . Night terrors   . Other and unspecified hyperlipidemia   . Other nonspecific finding on examination of urine   . Other symptoms involving digestive system(787.99)   . Ovarian cancer ([place])   . Palpitations   . PTSD (post-traumatic stress disorder)   . Restless legs syndrome (RLS)   . Tobacco use disorder   . Trichomonal vulvovaginitis   . Unspecified essential hypertension   . Unspecified sleep apnea     Her Past Surgical History Is Significant For: Past Surgical History:  Procedure Laterality Date  . KNEE CARTILAGE SURGERY     right  . tonsillectomy    . tympanic membrane  2009    Her Family History Is Significant For: Family History  Problem Relation Bailey of Onset  . Prostate cancer Maternal Grandfather   . Liver disease Maternal Grandfather   . Breast cancer Paternal Grandmother   . Diabetes Paternal Grandmother   . Bipolar disorder Paternal  Grandmother   . Alzheimer's disease Paternal Grandfather   . Parkinson's disease Paternal Grandfather   . Hypertension Brother   . Hypertension Father   . Diabetes Father   . Heart disease Father   . Depression Father   . Colon polyps Father   . Other Father     mad cow disease  . Bipolar disorder Sister   . Colon polyps Brother   . Colon polyps Mother   . Kidney disease Mother   . Clotting disorder Maternal Grandmother   . Irritable bowel syndrome Sister   . Irritable bowel syndrome Paternal Aunt   . Bipolar disorder Paternal Aunt   . Bipolar disorder Cousin     Her Social History Is Significant For: Social History   Social History  . Marital status: Divorced    Spouse name: N/A  . Number of children: 1  . Years of education: 74   Occupational History  . Disability     Social History Main Topics  . Smoking status: Current Every  Day Smoker    Packs/day: 1.00    Years: 22.00    Types: Cigarettes  . Smokeless tobacco: Never Used  . Alcohol use No  . Drug use: No  . Sexual activity: Not Currently    Partners: Male    Birth control/ protection: Abstinence   Other Topics Concern  . Not on file   Social History Narrative   Pt lives at home with mother and son.   Caffeine Use: 2 sodas weekly    Her Allergies Are:  No Known Allergies:   Her Current Medications Are:  Outpatient Encounter Prescriptions as of 09/28/2015  Medication Sig  . alprazolam (XANAX) 2 MG tablet Take 2 mg by mouth 3 (three) times daily as needed for sleep.   Marland Kitchen amphetamine-dextroamphetamine (ADDERALL XR) 30 MG 24 hr capsule Take 30 mg by mouth daily.   Marland Kitchen amphetamine-dextroamphetamine (ADDERALL) 30 MG tablet Take 30 mg by mouth daily.   . cyanocobalamin (,VITAMIN B-12,) 1000 MCG/ML injection Inject into the muscle.  Marland Kitchen desvenlafaxine (PRISTIQ) 100 MG 24 hr tablet Take 1 tablet (100 mg total) by mouth daily.  Marland Kitchen lithium 300 MG tablet Take 1 tablet by mouth as directed.  . lithium carbonate  (ESKALITH) 450 MG CR tablet Take 450 mg by mouth every morning.  . lurasidone (LATUDA) 80 MG TABS tablet Take 80 mg by mouth daily.  Marland Kitchen omeprazole (PRILOSEC) 20 MG capsule Take 20 mg by mouth daily.  . prazosin (MINIPRESS) 5 MG capsule Take 5 mg by mouth at bedtime.  . Prenat-FeFmCb-DSS-FA-DHA w/o A (CITRANATAL HARMONY) 27-1-260 MG CAPS TAKE 1 CAPSULE BY MOUTH DAILY BEFORE BREAKFAST.  Marland Kitchen Prenatal MV-Min-Fe Fum-FA-DHA (PRENATAL 1 PO) Take by mouth.  . propranolol ER (INDERAL LA) 120 MG 24 hr capsule Take 120 mg by mouth daily.  . Tiotropium Bromide Monohydrate (SPIRIVA RESPIMAT) 2.5 MCG/ACT AERS Inhale 2 puffs into the lungs daily.  Marland Kitchen zolpidem (AMBIEN) 10 MG tablet Take 20 mg by mouth at bedtime as needed for sleep.    No facility-administered encounter medications on file as of 09/28/2015.   :   Review of Systems:  Out of a complete 14 point review of systems, all are reviewed and negative with the exception of these symptoms as listed below:  Review of Systems  Neurological:       3-4 weeks ago patient started having weakness in R wrist and numbness in fingers. She is unable to straighten her R wrist. Now having L wrist/hand numbness and weakness. She is wearing wrist splints that help. Patient reports having MRI of brain and x-rays of back done at [facility].  Reports having sudden whole body "jerks".     Objective:  Neurologic Exam  Physical Exam Physical Examination:   Vitals:   09/28/15 1556  BP: (!) 108/58  Pulse: 70   General Examination: The patient is a very pleasant 40 y.o. female in no acute distress. She appears well-developed and well-nourished and adequately groomed.   HEENT: Normocephalic, atraumatic, pupils are equal, round and reactive to light and accommodation. Funduscopic exam is normal with sharp disc margins noted. Extraocular tracking is good without limitation to gaze excursion or nystagmus noted. Normal smooth pursuit is noted. Hearing is grossly intact.  Tympanic membranes are clear bilaterally. Face is symmetric with normal facial animation and normal facial sensation. Speech is clear with no dysarthria noted. There is no hypophonia. There is no lip, neck/head, jaw or voice tremor. Neck is supple with full range of passive and active  motion. There are no carotid bruits on auscultation. Oropharynx exam reveals: moderate mouth dryness, adequate dental hygiene.    Chest: Clear to auscultation without wheezing, rhonchi or crackles noted.  Heart: S1+S2+0, regular and normal without murmurs, rubs or gallops noted.   Abdomen: Soft, non-tender and non-distended with normal bowel sounds appreciated on auscultation.  Extremities: There is no pitting edema in the distal lower extremities bilaterally. Pedal pulses are intact.  Skin: Warm and dry without trophic changes noted. There are no varicose veins.  Musculoskeletal: exam reveals no obvious joint deformities, tenderness or joint swelling or erythema.   Neurologically:  Mental status: The patient is awake, alert and oriented in all 4 spheres. Her immediate and remote memory, attention, language skills and fund of knowledge are appropriate. There is no evidence of aphasia, agnosia, apraxia or anomia. Speech is clear with normal prosody and enunciation. Thought process is linear. Mood is constricted and affect is normal.  Cranial nerves II - XII are as described above under HEENT exam. In addition: shoulder shrug is normal with equal shoulder height noted. Motor exam: Normal bulk, strength and tone is noted. She is unable to voluntarily extend her right wrist, mild weakness noted in her left wrist extension, no atrophy, no fasciculations, but also unable to spread her fingers on her right hand, again, preserved reflexes throughout. Romberg is negative. Reflexes are 2+ throughout. Babinski: Toes are flexor bilaterally. Fine motor skills and coordination: intact with the LUE, unable to use her right hand in  that regard, lower extremity fine motor skills unremarkable bilaterally.  Cerebellar testing: Heel to shin is unremarkable bilaterally. There is no truncal or gait ataxia.  Sensory exam: intact to light touch, pinprick, vibration, temperature sense in the upper and lower extremities, but reports subjective numbness in both thumb areas.  Gait, station and balance: She stands easily. No veering to one side is noted. No leaning to one side is noted. Posture is Bailey-appropriate and stance is narrow based. Gait shows normal stride length and normal pace. No problems turning are noted. Tandem walk is unremarkable.  Assessment and Plan:    In summary, LAZARIAH SCOLLON is a very pleasant 40 y.o.-year old female with an underlying medical history of hyperlipidemia, hypertension, palpitations, B12 deficiency, smoking, restless leg syndrome, insomnia, depression, anxiety, followed by psychiatry for mood disorder, obesity and obstructive sleep apnea who  presents for initial consultation of a right distal hand weakness, exam not fully consistent with plain radial nerve palsy, also not consistent with other syndromes such as carpal tunnel syndrome, exam not suggestive of TIA or stroke, history also not consistent with stroke or TIA, history and physical exam not consistent with plexopathy or peripheral neuropathy, will investigate further with EMG and nerve conduction testing. Patient has been referred to occupational therapy and has been using wrist splints. She is encouraged to continue with therapy, pain medication is not indicated for this condition. We will call her with her EMG and nerve conduction test results. I answered all her questions today and she was in agreement. Of note, I did advise patient to stop smoking.   Thank you very much for allowing me to participate in the care of this nice patient. If I can be of any further assistance to you please do not hesitate to call me at  [phone].  Sincerely,   Judy Age, MD, PhD

## 2015-09-28 NOTE — Therapy (Signed)
[facility] [address] [place], [place], [address] Phone: [phone]   Fax:  [phone]  Occupational Therapy Treatment  Patient Details  Name: Judy Bailey MRN: [id number] Date of Birth: 11-17-1975 Referring Provider: Dr. Precious Haws  Encounter Date: 09/28/2015      OT End of Session - 09/28/15 0913    Visit Number 2   Number of Visits 8   Date for OT Re-Evaluation 10/19/15   Authorization Type medicare - will need G code and PN every 10th visit   Authorization Time Period 60 days   Authorization - Visit Number 1   Authorization - Number of Visits 10   OT Start Time 0903   OT Stop Time 0928   OT Time Calculation (min) 25 min   Activity Tolerance Patient tolerated treatment well   Behavior During Therapy [facility] for tasks assessed/performed      Past Medical History:  Diagnosis Date  . Absence of menstruation   . Agoraphobia with panic disorder   . Anxiety   . Attention deficit disorder without mention of hyperactivity   . Backache, unspecified   . Bipolar disorder ([place])   . Body mass index 30.0-30.9, adult   . Borderline personality disorder   . Depressive disorder, not elsewhere classified   . Diabetes mellitus without complication ([place])   . Disturbance of skin sensation   . Dyspepsia and other specified disorders of function of stomach   . Insomnia, unspecified   . Leukorrhea, not specified as infective   . Night terrors   . Other and unspecified hyperlipidemia   . Other nonspecific finding on examination of urine   . Other symptoms involving digestive system(787.99)   . Ovarian cancer ([place])   . Palpitations   . PTSD (post-traumatic stress disorder)   . Restless legs syndrome (RLS)   . Tobacco use disorder   . Trichomonal vulvovaginitis   . Unspecified essential hypertension   . Unspecified sleep apnea     Past Surgical History:  Procedure Laterality Date  . KNEE CARTILAGE SURGERY     right   . tonsillectomy    . tympanic membrane  2009    There were no vitals filed for this visit.      Subjective Assessment - 09/28/15 0902    Subjective  Denies pain   Currently in Pain? No/denies                              OT Education - 09/28/15 0915    Education provided Yes   Education Details splint wear, care and precautions, inital HEP see pt instructions   Person(s) Educated Patient   Methods Explanation;Demonstration   Comprehension Verbalized understanding;Returned demonstration             OT Long Term Goals - 09/21/15 1220      OT LONG TERM GOAL #1   Title Pt will be mod I with HEP - 10/19/2015   Status New     OT LONG TERM GOAL #2   Title Pt will be mod I with splint wear and care prn   Status New     OT LONG TERM GOAL #3   Title Pt will demonstrate ability to button and tie, using splints as needed   Status New     OT LONG TERM GOAL #4   Title Pt will be  mod I with simple hot meal prep  Status New     OT LONG TERM GOAL #5   Title Pt will demonstrate ability for functional grip strength for basic ADL tasks   Status New     Long Term Additional Goals   Additional Long Term Goals Yes     OT LONG TERM GOAL #6   Title Pt will demonstrate ability to write at sentence level AE prn   Status New               Plan - 09/28/15 0902    Clinical Impression Statement Pt is progressing towards goals. Bilateral splints issued along with inital HEP. Pt verbalizes understanding.    Rehab Potential Fair   Clinical Impairments Affecting Rehab Potential etiology, inconsistent clinical presentation,    OT Frequency 2x / week   OT Duration 4 weeks   OT Treatment/Interventions Self-care/ADL training;Electrical Stimulation;DME and/or AE instruction;Neuromuscular education;Therapeutic exercise;Manual Therapy;Passive range of motion;Therapeutic activities;Patient/family education   Plan splint check, A/ROM, P/ROM   Consulted and Agree  with Plan of Care Patient      Patient will benefit from skilled therapeutic intervention in order to improve the following deficits and impairments:  Decreased range of motion, Decreased strength, Impaired perceived functional ability, Impaired UE functional use, Impaired sensation  Visit Diagnosis: Muscle weakness (generalized)  Other disturbances of skin sensation  Stiffness of right hand, not elsewhere classified    Problem List Patient Active Problem List   Diagnosis Date Noted  . Exertional dyspnea 05/18/2015  . Tobacco user 05/18/2015  . Hypoxemia 05/18/2015  . BV (bacterial vaginosis) 04/24/2013  . Mild dysplasia of cervix 04/24/2013  . Furuncle of buttock 04/09/2013  . Pain 04/09/2013  . Papanicolaou smear of cervix with low grade squamous intraepithelial lesion (LGSIL) 03/10/2013  . Essential (primary) hypertension 02/18/2013  . Postural tremor 06/05/2012  . 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 Hyperlipidemia 04/26/2012  . Obstructive sleep apnea 04/26/2012  . H/O knee surgery 04/26/2012  . H/O external ear surgery 04/26/2012    Judy Bailey 09/28/2015, 8:28 PM  [facility] [address] [place], [place], [address] Phone: [phone]   Fax:  [phone]  Name: Judy Bailey MRN: [id number] Date of Birth: [birth year]

## 2015-09-28 NOTE — Patient Instructions (Signed)
Your Splint This splint should initially be fitted by a healthcare practitioner.  The healthcare practitioner is responsible for providing wearing instructions and precautions to the patient, other healthcare practitioners and care provider involved in the patient's care.  This splint was custom made for you. Please read the following instructions to learn about wearing and caring for your splint.  Precautions Should your splint cause any of the following problems, remove the splint immediately and contact your therapist/physician.  Swelling  Severe Pain  Pressure areas  When To Wear Your Splint Where your splint according to your therapist/physician instructions. Wear your splint during the daytime Remove splint every 3-4 hours  To perform exercises and allow air to get to your hand and wrist  You may sleep in the braces for protection and increased comfort.   Only wear braces while driving if you have good control while wearing brace, you may want to wear only right brace so that you have more freedom for driving with left hand.    AROM: Wrist Extension   .  With _left___ palm down, bend wrist up. Perform on the tabletop with hand sideways for right hand, assist with left gently. Repeat __2 sets of 5 reps each set.  Do __4_ sessions per day.       AROM: Forearm Pronation / Supination   With _left_then right __ arm in handshake position, slowly rotate palm down until stretch is felt. Relax. Then rotate palm up until stretch is felt. Repeat _2 sets of 5 reps per set. Do _4__ sessions per day.  Copyright  VHI. All rights reserved.           AROM: Finger Flexion / Extension   Actively bend fingers of  hand. Start with knuckles furthest from palm, and slowly make a fist. Hold __5__ seconds. Relax. Then straighten fingers as far as possible. Perform actively with left hand and assist to perform with right. Repeat _10__ times per set.  Do _4___ sessions per  day.  Copyright  VHI. All rights reserved.

## 2015-09-28 NOTE — Patient Instructions (Signed)
We will do an EMG and nerve conduction velocity test, which is an electrical nerve and muscle test, which we will schedule. We will call you with the results. Continue physical therapy as scheduled.

## 2015-10-01 ENCOUNTER — Ambulatory Visit: Payer: Medicare Other | Admitting: Occupational Therapy

## 2015-10-01 DIAGNOSIS — M25641 Stiffness of right hand, not elsewhere classified: Secondary | ICD-10-CM

## 2015-10-01 DIAGNOSIS — M6281 Muscle weakness (generalized): Secondary | ICD-10-CM

## 2015-10-01 NOTE — Therapy (Signed)
[facility] [address] [place], [place], [address] Phone: [phone]   Fax:  [phone]  Occupational Therapy Treatment  Patient Details  Name: Judy Bailey MRN: [id number] Date of Birth: 12/12/1975 Referring Provider: Dr. Precious Haws  Encounter Date: 10/01/2015      OT End of Session - 10/01/15 1201    Visit Number 3   Number of Visits 8   Date for OT Re-Evaluation 10/19/15   Authorization Type medicare - will need G code and PN every 10th visit   Authorization Time Period 60 days   Authorization - Visit Number 3   Authorization - Number of Visits 10   OT Start Time 1150   OT Stop Time 1230   OT Time Calculation (min) 40 min      Past Medical History:  Diagnosis Date  . Absence of menstruation   . Agoraphobia with panic disorder   . Anxiety   . Attention deficit disorder without mention of hyperactivity   . Backache, unspecified   . Bipolar disorder ([place])   . Body mass index 30.0-30.9, adult   . Borderline personality disorder   . Depressive disorder, not elsewhere classified   . Diabetes mellitus without complication ([place])   . Disturbance of skin sensation   . Dyspepsia and other specified disorders of function of stomach   . Insomnia, unspecified   . Leukorrhea, not specified as infective   . Night terrors   . Other and unspecified hyperlipidemia   . Other nonspecific finding on examination of urine   . Other symptoms involving digestive system(787.99)   . Ovarian cancer ([place])   . Palpitations   . PTSD (post-traumatic stress disorder)   . Restless legs syndrome (RLS)   . Tobacco use disorder   . Trichomonal vulvovaginitis   . Unspecified essential hypertension   . Unspecified sleep apnea     Past Surgical History:  Procedure Laterality Date  . KNEE CARTILAGE SURGERY     right  . tonsillectomy    . tympanic membrane  2009    There were no vitals filed for this visit.       Subjective Assessment - 10/01/15 1159    Subjective  Pt reports back pain   Currently in Pain? Yes   Pain Location Back   Pain Descriptors / Indicators Aching   Pain Type Acute pain   Pain Onset Yesterday   Pain Frequency Intermittent   Aggravating Factors  malpostitioning   Pain Relieving Factors unknown   Multiple Pain Sites No       Treatment: Fluidotherapy x 11 mins to bilateral UE's to address stiffness and to provide sensory imput. No adverse reactions. Reviewed HEP 10-15 reps each, min v.c. And demonstration. Therapist recommends that pt supports arms on table to prevent shoulder hiking and back pain. Therapist reviewed splint application and importance of keeping wrist in a more neutral position when splint is off and avoiding compensations with right shoulder and elbow. Flipping playing cards with bilateral UE's, while wearing braces, pt was cautioned against malpositioning with left elbow and shoulder.                            OT Long Term Goals - 09/21/15 1220      OT LONG TERM GOAL #1   Title Pt will be mod I with HEP - 10/19/2015   Status New     OT LONG TERM GOAL #  2   Title Pt will be mod I with splint wear and care prn   Status New     OT LONG TERM GOAL #3   Title Pt will demonstrate ability to button and tie, using splints as needed   Status New     OT LONG TERM GOAL #4   Title Pt will be  mod I with simple hot meal prep   Status New     OT LONG TERM GOAL #5   Title Pt will demonstrate ability for functional grip strength for basic ADL tasks   Status New     Long Term Additional Goals   Additional Long Term Goals Yes     OT LONG TERM GOAL #6   Title Pt will demonstrate ability to write at sentence level AE prn   Status New               Plan - 10/01/15 1200    Clinical Impression Statement Pt is progressing towards goals. She demonstrates understanding of splint application.   Rehab Potential Fair   Clinical  Impairments Affecting Rehab Potential etiology, inconsistent clinical presentation,    OT Frequency 2x / week   OT Duration 4 weeks   OT Treatment/Interventions Self-care/ADL training;Electrical Stimulation;DME and/or AE instruction;Neuromuscular education;Therapeutic exercise;Manual Therapy;Passive range of motion;Therapeutic activities;Patient/family education   Plan A/ROM, P/ROM, UE functional use   Consulted and Agree with Plan of Care Patient      Patient will benefit from skilled therapeutic intervention in order to improve the following deficits and impairments:  Decreased range of motion, Decreased strength, Impaired perceived functional ability, Impaired UE functional use, Impaired sensation  Visit Diagnosis: Stiffness of right hand, not elsewhere classified  Muscle weakness (generalized)    Problem List Patient Active Problem List   Diagnosis Date Noted  . Exertional dyspnea 05/18/2015  . Tobacco user 05/18/2015  . Hypoxemia 05/18/2015  . BV (bacterial vaginosis) 04/24/2013  . Mild dysplasia of cervix 04/24/2013  . Furuncle of buttock 04/09/2013  . Pain 04/09/2013  . Papanicolaou smear of cervix with low grade squamous intraepithelial lesion (LGSIL) 03/10/2013  . Essential (primary) hypertension 02/18/2013  . Postural tremor 06/05/2012  . 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 Hyperlipidemia 04/26/2012  . Obstructive sleep apnea 04/26/2012  . H/O knee surgery 04/26/2012  . H/O external ear surgery 04/26/2012    Hitesh Fouche 10/01/2015, 12:02 PM Theone Murdoch, OTR/L [phone] Phone: [phone] 12:02 PM 10/01/15 [facility] [address] [place], [place], [address] Phone: [phone]   Fax:  [phone]  Name: Judy Bailey MRN: [id number] Date of Birth: 1975/10/18

## 2015-10-06 ENCOUNTER — Ambulatory Visit: Payer: Medicare Other | Admitting: Occupational Therapy

## 2015-10-06 VITALS — BP 117/82

## 2015-10-06 DIAGNOSIS — M6281 Muscle weakness (generalized): Secondary | ICD-10-CM | POA: Diagnosis not present

## 2015-10-06 DIAGNOSIS — R208 Other disturbances of skin sensation: Secondary | ICD-10-CM

## 2015-10-06 DIAGNOSIS — M25641 Stiffness of right hand, not elsewhere classified: Secondary | ICD-10-CM

## 2015-10-06 NOTE — Patient Instructions (Signed)
1. Grip Strengthening (Resistive Putty)   Squeeze putty using thumb and all fingers of left hand Repeat _10-20__ times. Do __2__ sessions per day.   2. Roll putty into tube on table and pinch between each finger and thumb x 10 reps each. (can do ring and small finger together)left hand only.     Copyright  VHI. All rights reserved.

## 2015-10-06 NOTE — Therapy (Signed)
[facility] [address] [place], [place], [address] Phone: [phone]   Fax:  [phone]  Occupational Therapy Treatment  Patient Details  Name: Judy Bailey MRN: [id number] Date of Birth: 1975/06/23 Referring Provider: Dr. Precious Haws  Encounter Date: 10/06/2015      OT End of Session - 10/06/15 0948    Visit Number 4   Number of Visits 8   Date for OT Re-Evaluation 10/19/15   Authorization Type medicare - will need G code and PN every 10th visit   Authorization Time Period 60 days   Authorization - Visit Number 4   Authorization - Number of Visits 10   OT Start Time 0815   OT Stop Time 0845   OT Time Calculation (min) 30 min      Past Medical History:  Diagnosis Date  . Absence of menstruation   . Agoraphobia with panic disorder   . Anxiety   . Attention deficit disorder without mention of hyperactivity   . Backache, unspecified   . Bipolar disorder ([place])   . Body mass index 30.0-30.9, adult   . Borderline personality disorder   . Depressive disorder, not elsewhere classified   . Diabetes mellitus without complication ([place])   . Disturbance of skin sensation   . Dyspepsia and other specified disorders of function of stomach   . Insomnia, unspecified   . Leukorrhea, not specified as infective   . Night terrors   . Other and unspecified hyperlipidemia   . Other nonspecific finding on examination of urine   . Other symptoms involving digestive system(787.99)   . Ovarian cancer ([place])   . Palpitations   . PTSD (post-traumatic stress disorder)   . Restless legs syndrome (RLS)   . Tobacco use disorder   . Trichomonal vulvovaginitis   . Unspecified essential hypertension   . Unspecified sleep apnea     Past Surgical History:  Procedure Laterality Date  . KNEE CARTILAGE SURGERY     right  . tonsillectomy    . tympanic membrane  2009    There were no vitals filed for this visit.       Subjective Assessment - 10/06/15 0838    Subjective  Pt reports she was late because of accident this morning   Currently in Pain? Yes   Pain Score 7    Pain Location Hand   Pain Orientation Right   Pain Descriptors / Indicators Aching   Pain Type Acute pain   Pain Onset Yesterday   Pain Frequency Intermittent   Aggravating Factors  malpositioning   Pain Relieving Factors heat   Multiple Pain Sites No          Treatment: Fluidotherapy x 8 mins for pain and stiffness, fluido was stopped early as pt began sweating significantly. Pt reports she had a pain attack due to the stress of getting to therapy late this a.m and traffic around an accident. Reviewed bilateral supination / pronation, A/ROM, A/ROM wrist extension for LUE and P/ROM wrist extension for RUE with pt assisting with LUE. Pt was instructed in yellow putty HEP for LUE, see pt instructions.                          OT Long Term Goals - 09/21/15 1220      OT LONG TERM GOAL #1   Title Pt will be mod I with HEP - 10/19/2015   Status New  OT LONG TERM GOAL #2   Title Pt will be mod I with splint wear and care prn   Status New     OT LONG TERM GOAL #3   Title Pt will demonstrate ability to button and tie, using splints as needed   Status New     OT LONG TERM GOAL #4   Title Pt will be  mod I with simple hot meal prep   Status New     OT LONG TERM GOAL #5   Title Pt will demonstrate ability for functional grip strength for basic ADL tasks   Status New     Long Term Additional Goals   Additional Long Term Goals Yes     OT LONG TERM GOAL #6   Title Pt will demonstrate ability to write at sentence level AE prn   Status New               Plan - 10/06/15 0940    Clinical Impression Statement Pt is progressing towards goals slowly. Pt demonstrates inconsistent ability to use bilateral UE's functionally. Pt is able to donn/doff braces independently yet she demonstrates inability to  perform active wrist extension with RUE.   Rehab Potential Fair   Clinical Impairments Affecting Rehab Potential etiology, inconsistent clinical presentation,    OT Frequency 2x / week   OT Duration 4 weeks   OT Treatment/Interventions Self-care/ADL training;Electrical Stimulation;DME and/or AE instruction;Neuromuscular education;Therapeutic exercise;Manual Therapy;Passive range of motion;Therapeutic activities;Patient/family education   Plan continue A/ROM, P/ROM and functional use of UE's   Consulted and Agree with Plan of Care Patient      Patient will benefit from skilled therapeutic intervention in order to improve the following deficits and impairments:  Decreased range of motion, Decreased strength, Impaired perceived functional ability, Impaired UE functional use, Impaired sensation  Visit Diagnosis: Stiffness of right hand, not elsewhere classified  Muscle weakness (generalized)  Other disturbances of skin sensation    Problem List Patient Active Problem List   Diagnosis Date Noted  . Exertional dyspnea 05/18/2015  . Tobacco user 05/18/2015  . Hypoxemia 05/18/2015  . BV (bacterial vaginosis) 04/24/2013  . Mild dysplasia of cervix 04/24/2013  . Furuncle of buttock 04/09/2013  . Pain 04/09/2013  . Papanicolaou smear of cervix with low grade squamous intraepithelial lesion (LGSIL) 03/10/2013  . Essential (primary) hypertension 02/18/2013  . Postural tremor 06/05/2012  . 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 Hyperlipidemia 04/26/2012  . Obstructive sleep apnea 04/26/2012  . H/O knee surgery 04/26/2012  . H/O external ear surgery 04/26/2012    Arvilla Salada 10/06/2015, 9:48 AM  [facility] [address] [place], [place], [address] Phone: [phone]   Fax:  [phone]  Name: ARYANNA MATIC MRN: [id number] Date of Birth: June 28, 1975

## 2015-10-07 ENCOUNTER — Ambulatory Visit: Payer: Medicare Other | Admitting: Neurology

## 2015-10-07 ENCOUNTER — Ambulatory Visit: Payer: Medicare Other | Admitting: Occupational Therapy

## 2015-10-13 ENCOUNTER — Ambulatory Visit: Payer: Medicare Other | Admitting: Occupational Therapy

## 2015-10-13 DIAGNOSIS — M6281 Muscle weakness (generalized): Secondary | ICD-10-CM

## 2015-10-13 DIAGNOSIS — M25641 Stiffness of right hand, not elsewhere classified: Secondary | ICD-10-CM

## 2015-10-13 DIAGNOSIS — R208 Other disturbances of skin sensation: Secondary | ICD-10-CM

## 2015-10-13 NOTE — Therapy (Signed)
[facility] [address] [place], [place], [address] Phone: [phone]   Fax:  [phone]  Occupational Therapy Treatment  Patient Details  Name: Judy Bailey MRN: [id number] Date of Birth: 1975/12/04 Referring Provider: Dr. Precious Haws  Encounter Date: 10/13/2015      OT End of Session - 10/13/15 1212    Visit Number 5   Number of Visits 8   Date for OT Re-Evaluation 10/19/15   Authorization Type medicare - will need G code and PN every 10th visit   Authorization Time Period 60 days   Authorization - Visit Number 5   Authorization - Number of Visits 10   OT Start Time 0807   OT Stop Time 0847   OT Time Calculation (min) 40 min   Activity Tolerance Patient tolerated treatment well   Behavior During Therapy [facility] for tasks assessed/performed      Past Medical History:  Diagnosis Date  . Absence of menstruation   . Agoraphobia with panic disorder   . Anxiety   . Attention deficit disorder without mention of hyperactivity   . Backache, unspecified   . Bipolar disorder ([place])   . Body mass index 30.0-30.9, adult   . Borderline personality disorder   . Depressive disorder, not elsewhere classified   . Diabetes mellitus without complication ([place])   . Disturbance of skin sensation   . Dyspepsia and other specified disorders of function of stomach   . Insomnia, unspecified   . Leukorrhea, not specified as infective   . Night terrors   . Other and unspecified hyperlipidemia   . Other nonspecific finding on examination of urine   . Other symptoms involving digestive system(787.99)   . Ovarian cancer ([place])   . Palpitations   . PTSD (post-traumatic stress disorder)   . Restless legs syndrome (RLS)   . Tobacco use disorder   . Trichomonal vulvovaginitis   . Unspecified essential hypertension   . Unspecified sleep apnea     Past Surgical History:  Procedure Laterality Date  . KNEE CARTILAGE SURGERY     right   . tonsillectomy    . tympanic membrane  2009    There were no vitals filed for this visit.      Subjective Assessment - 10/13/15 0831    Subjective  Pt reports that her hand pain is better   Limitations Do not use Fluido it sets off panic attack   Currently in Pain? Yes   Pain Score 7    Pain Location Back   Pain Descriptors / Indicators Aching   Pain Type Chronic pain   Pain Onset More than a month ago   Pain Frequency Intermittent   Aggravating Factors  malpositioning   Pain Relieving Factors reposistioning   Multiple Pain Sites No            [facility] OT Assessment - 10/13/15 0001      Precautions   Precautions Other (comment)   Precaution Comments Do not use fluido, it sets off panic attack.            Treatment:Reviewed bilateral supination / pronation, A/ROM, A/ROM wrist extension for LUE and AA/ROM wrist extension for RUE with pt assisting with LUE, A/ROM supination/ pronation with improved performance today, min v.c. For proper wrist positioning Pt's putty exercises were upgraded to red for LUE and pt performed yellow with RUE with wrist supported and wearing brace for finger/ thumb opposition. Pt was issued foam grip for pen  for writing and for stylus for phone, Pt demonstrates improved ability to write legibiliy with AE.                    OT Long Term Goals - 10/13/15 0837      OT LONG TERM GOAL #1   Title Pt will be mod I with HEP - 10/19/2015   Status Achieved     OT LONG TERM GOAL #2   Title Pt will be mod I with splint wear and care prn   Status Achieved     OT LONG TERM GOAL #3   Title Pt will demonstrate ability to button and tie, using splints as needed     OT LONG TERM GOAL #4   Title Pt will be  mod I with simple hot meal prep   Status On-going     OT LONG TERM GOAL #5   Title Pt will demonstrate ability for functional grip strength for basic ADL tasks   Status On-going     OT LONG TERM GOAL #6   Title Pt will  demonstrate ability to write at sentence level AE prn   Status On-going               Plan - 10/13/15 1205    Clinical Impression Statement Pt is progressing towards goals. She demonstrates improving overall bilateral functional use and strength yet inconsistent presentation for RUE. Pt has nerve conduction study first week in October.   Rehab Potential Fair   Clinical Impairments Affecting Rehab Potential etiology, inconsistent clinical presentation,    OT Frequency 2x / week   OT Duration 4 weeks   OT Treatment/Interventions Self-care/ADL training;Electrical Stimulation;DME and/or AE instruction;Neuromuscular education;Therapeutic exercise;Manual Therapy;Passive range of motion;Therapeutic activities;Patient/family education   Plan continue A/ROM, P/ROM strengthening, and functional use of UE's   Consulted and Agree with Plan of Care Patient      Patient will benefit from skilled therapeutic intervention in order to improve the following deficits and impairments:  Decreased range of motion, Decreased strength, Impaired perceived functional ability, Impaired UE functional use, Impaired sensation  Visit Diagnosis: Stiffness of right hand, not elsewhere classified  Muscle weakness (generalized)  Other disturbances of skin sensation    Problem List Patient Active Problem List   Diagnosis Date Noted  . Exertional dyspnea 05/18/2015  . Tobacco user 05/18/2015  . Hypoxemia 05/18/2015  . BV (bacterial vaginosis) 04/24/2013  . Mild dysplasia of cervix 04/24/2013  . Furuncle of buttock 04/09/2013  . Pain 04/09/2013  . Papanicolaou smear of cervix with low grade squamous intraepithelial lesion (LGSIL) 03/10/2013  . Essential (primary) hypertension 02/18/2013  . Postural tremor 06/05/2012  . 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 Hyperlipidemia 04/26/2012  . Obstructive sleep apnea 04/26/2012  . H/O knee surgery 04/26/2012  . H/O external ear surgery 04/26/2012     Ezrah Dembeck 10/13/2015, 12:14 PM Theone Murdoch, OTR/L [phone] Phone: [phone] 12:23 PM 10/13/15 [facility] [address] [place], [place], [address] Phone: [phone]   Fax:  [phone]  Name: MESCHELLE SASS MRN: [id number] Date of Birth: 07-18-1975

## 2015-10-15 ENCOUNTER — Ambulatory Visit: Payer: Medicare Other | Admitting: Occupational Therapy

## 2015-10-19 ENCOUNTER — Ambulatory Visit: Payer: Medicare Other | Admitting: Occupational Therapy

## 2015-10-19 ENCOUNTER — Encounter (HOSPITAL_COMMUNITY): Payer: Self-pay | Admitting: Emergency Medicine

## 2015-10-19 ENCOUNTER — Emergency Department (HOSPITAL_COMMUNITY)
Admission: EM | Admit: 2015-10-19 | Discharge: 2015-10-19 | Disposition: A | Discharge status: Home / Self Care | Payer: Medicare Other | Attending: Dermatology | Admitting: Dermatology

## 2015-10-19 DIAGNOSIS — I10 Essential (primary) hypertension: Secondary | ICD-10-CM | POA: Insufficient documentation

## 2015-10-19 DIAGNOSIS — Z5321 Procedure and treatment not carried out due to patient leaving prior to being seen by health care provider: Secondary | ICD-10-CM | POA: Insufficient documentation

## 2015-10-19 DIAGNOSIS — E119 Type 2 diabetes mellitus without complications: Secondary | ICD-10-CM | POA: Diagnosis not present

## 2015-10-19 DIAGNOSIS — R064 Hyperventilation: Secondary | ICD-10-CM | POA: Diagnosis present

## 2015-10-19 DIAGNOSIS — F17210 Nicotine dependence, cigarettes, uncomplicated: Secondary | ICD-10-CM | POA: Insufficient documentation

## 2015-10-19 NOTE — ED Triage Notes (Signed)
Patient here from home with complaints of body cramping when hyperventilating. States that this is day 5 of here coming off heroine. Has not been eating or drinking. CBG 121.

## 2015-10-19 NOTE — ED Notes (Signed)
Pt chose not to stay.  Stated she felt better.

## 2015-10-21 ENCOUNTER — Ambulatory Visit: Payer: Medicare Other | Admitting: Occupational Therapy

## 2015-10-21 DIAGNOSIS — R208 Other disturbances of skin sensation: Secondary | ICD-10-CM

## 2015-10-21 DIAGNOSIS — M6281 Muscle weakness (generalized): Secondary | ICD-10-CM | POA: Diagnosis not present

## 2015-10-21 DIAGNOSIS — M25641 Stiffness of right hand, not elsewhere classified: Secondary | ICD-10-CM

## 2015-10-21 NOTE — Therapy (Signed)
[facility] [address] [place], [place], [address] Phone: [phone]   Fax:  [phone]  Occupational Therapy Treatment  Patient Details  Name: Judy Bailey MRN: [id number] Date of Birth: December 27, 1975 Referring Provider: Dr. Precious Haws  Encounter Date: 10/21/2015      OT End of Session - 10/21/15 1250    Visit Number 6   Number of Visits 8   Date for OT Re-Evaluation 10/21/15   Authorization Type medicare - will need G code and PN every 10th visit   Authorization Time Period 60 days   Authorization - Visit Number 6   Authorization - Number of Visits 10   OT Start Time 1106   OT Stop Time 1147   OT Time Calculation (min) 41 min   Activity Tolerance Patient tolerated treatment well   Behavior During Therapy [facility] for tasks assessed/performed      Past Medical History:  Diagnosis Date  . Absence of menstruation   . Agoraphobia with panic disorder   . Anxiety   . Attention deficit disorder without mention of hyperactivity   . Backache, unspecified   . Bipolar disorder ([place])   . Body mass index 30.0-30.9, adult   . Borderline personality disorder   . Depressive disorder, not elsewhere classified   . Diabetes mellitus without complication ([place])   . Disturbance of skin sensation   . Dyspepsia and other specified disorders of function of stomach   . Insomnia, unspecified   . Leukorrhea, not specified as infective   . Night terrors   . Other and unspecified hyperlipidemia   . Other nonspecific finding on examination of urine   . Other symptoms involving digestive system(787.99)   . Ovarian cancer ([place])   . Palpitations   . PTSD (post-traumatic stress disorder)   . Restless legs syndrome (RLS)   . Tobacco use disorder   . Trichomonal vulvovaginitis   . Unspecified essential hypertension   . Unspecified sleep apnea     Past Surgical History:  Procedure Laterality Date  . KNEE CARTILAGE SURGERY     right   . tonsillectomy    . tympanic membrane  2009    There were no vitals filed for this visit.      Subjective Assessment - 10/21/15 1107    Subjective  Pt reports that her right hand is a lot better   Limitations Do not use Fluido it sets off panic attack   Currently in Pain? No/denies              Treatment: A/ROM wrist flexion/ extension, supination/ pronation finger flexion/ extension for RUE and strengthening with 1lbs weight for these motions with LUE.  Therapist upgraded LUE putty HEP to green, and pt performed putty for RUE with yellow, pt returned demonstration. Therapist  checked progress towards goals. Pt is now able to perform tying and fasten buttons independently and she was able to perform simple cooking task modified independently. Pt to be placed on hold until after nerve conduction study. Pt is scheduled for 1 additional visit if needed to upgrade HEP, otherwise pt will call and cancel.               OT Education - 10/21/15 1249    Education provided Yes   Education Details updates to HEP for strengthening LUE, and A/ROM for RUE, progress towards goals   Person(s) Educated Patient   Methods Demonstration;Explanation;Verbal cues;Handout   Comprehension Verbalized understanding;Returned demonstration  OT Long Term Goals - 10/21/15 1133      OT LONG TERM GOAL #1   Title Pt will be mod I with HEP -  Pt will be mod I with updated HEP PRN.   Status Revised     OT LONG TERM GOAL #2   Title Pt will be mod I with splint wear and care prn   Status Achieved     OT LONG TERM GOAL #3   Title Pt will demonstrate ability to button and tie, using splints as needed   Status Achieved     OT LONG TERM GOAL #4   Title Pt will be  mod I with simple hot meal prep   Status Achieved     OT LONG TERM GOAL #5   Title Pt will demonstrate ability for functional grip strength for basic ADL tasks   Status Achieved     OT LONG TERM GOAL #6    Title Pt will demonstrate ability to write at sentence level AE prn   Status Achieved               Plan - 10/21/15 1112    Clinical Impression Statement Pt demonstrates excellent overall progress. She demonstrates improved A/ROM and strength. Pt has her nerve conduction study next week.   Rehab Potential Fair   Clinical Impairments Affecting Rehab Potential etiology, inconsistent clinical presentation,    OT Frequency 2x / week   OT Duration 4 weeks   OT Treatment/Interventions Self-care/ADL training;Electrical Stimulation;DME and/or AE instruction;Neuromuscular education;Therapeutic exercise;Manual Therapy;Passive range of motion;Therapeutic activities;Patient/family education   Plan continue A/ROM, P/ROM, strengthening, pt on hold until after nerve conduction   Consulted and Agree with Plan of Care Patient      Patient will benefit from skilled therapeutic intervention in order to improve the following deficits and impairments:  Decreased range of motion, Decreased strength, Impaired perceived functional ability, Impaired UE functional use, Impaired sensation  Visit Diagnosis: Stiffness of right hand, not elsewhere classified  Muscle weakness (generalized)  Other disturbances of skin sensation    Problem List Patient Active Problem List   Diagnosis Date Noted  . Exertional dyspnea 05/18/2015  . Tobacco user 05/18/2015  . Hypoxemia 05/18/2015  . BV (bacterial vaginosis) 04/24/2013  . Mild dysplasia of cervix 04/24/2013  . Furuncle of buttock 04/09/2013  . Pain 04/09/2013  . Papanicolaou smear of cervix with low grade squamous intraepithelial lesion (LGSIL) 03/10/2013  . Essential (primary) hypertension 02/18/2013  . Postural tremor 06/05/2012  . 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 Hyperlipidemia 04/26/2012  . Obstructive sleep apnea 04/26/2012  . H/O knee surgery 04/26/2012  . H/O external ear surgery 04/26/2012    RINE,KATHRYN 10/21/2015, 12:52 PM Theone Murdoch,  OTR/L [phone] Phone: [phone] 1:00 PM 10/21/15 [facility] [address] [place], [place], [address] Phone: [phone]   Fax:  [phone]  Name: Judy Bailey MRN: [id number] Date of Birth: [birth year]

## 2015-10-21 NOTE — Patient Instructions (Signed)
AROM: Wrist Extension   .  With _left___ palm down, bend wrist up. With water bottle or 1 lbs weight Repeat __1_ times per set.  Do __2__ sessions per day.    AROM: Wrist Flexion   With__left__ palm up, bend wrist up.with water bottle Repeat __10__ times per set.  Do _2_ sessions per day.   AROM: Forearm Pronation / Supination   With _left___ arm in handshake position, slowly rotate palm down until stretch is felt. Relax. Then rotate palm up until stretch is felt. Hold water bottle of 1 lbs weight Repeat _10__ times per set. Do _2__ sessions per day.  Copyright  VHI. All rights reserved.

## 2015-10-26 ENCOUNTER — Encounter: Payer: Self-pay | Admitting: Neurology

## 2015-10-26 ENCOUNTER — Telehealth: Payer: Self-pay

## 2015-10-26 ENCOUNTER — Ambulatory Visit (INDEPENDENT_AMBULATORY_CARE_PROVIDER_SITE_OTHER): Payer: Self-pay | Admitting: Neurology

## 2015-10-26 ENCOUNTER — Ambulatory Visit (INDEPENDENT_AMBULATORY_CARE_PROVIDER_SITE_OTHER): Payer: Medicare Other | Admitting: Neurology

## 2015-10-26 ENCOUNTER — Encounter: Payer: Self-pay | Admitting: Occupational Therapy

## 2015-10-26 DIAGNOSIS — M21331 Wrist drop, right wrist: Secondary | ICD-10-CM

## 2015-10-26 DIAGNOSIS — G5631 Lesion of radial nerve, right upper limb: Secondary | ICD-10-CM

## 2015-10-26 HISTORY — DX: Lesion of radial nerve, right upper limb: G56.31

## 2015-10-26 NOTE — Procedures (Signed)
     HISTORY:  Judy Bailey is a 40 year old patient with a history of bilateral wrist drops, worse on the right. The patient had onset of these symptoms within the last couple months, the left wrist drop has significant improved. The patient is being evaluated for the right wrist drop.  NERVE CONDUCTION STUDIES:  Nerve conduction studies were performed on both upper extremities. The distal motor latencies and motor amplitudes for the median, radial, and ulnar nerves were within normal limits. The F wave latencies and nerve conduction velocities for the median and ulnar nerves were also normal. The sensory latencies for the median, radial, and ulnar nerves were normal.   EMG STUDIES:  EMG study was performed on the right upper extremity:  The first dorsal interosseous muscle reveals 2 to 4 K units with full recruitment. No fibrillations or positive waves were noted. The abductor pollicis brevis muscle reveals 2 to 4 K units with full recruitment. No fibrillations or positive waves were noted. The extensor indicis proprius muscle reveals 1 to 3 K units with decreased recruitment. 2+ positive waves were noted. Fast firing motor units were seen. The pronator teres muscle reveals 2 to 3 K units with full recruitment. No fibrillations or positive waves were noted. The brachioradialis muscle reveals 1 to 3 K units with decreased recruitment. 2+ positive waves were seen. The biceps muscle reveals 1 to 2 K units with full recruitment. No fibrillations or positive waves were noted. The triceps muscle reveals 2 to 4 K units with full recruitment. No fibrillations or positive waves were noted. The anterior deltoid muscle reveals 2 to 3 K units with full recruitment. No fibrillations or positive waves were noted. The cervical paraspinal muscles were tested at 2 levels. No abnormalities of insertional activity were seen at either level tested. There was fair relaxation.   IMPRESSION:  Nerve  conduction studies done on both upper extremities were within normal limits. No evidence of a neuropathy is seen. 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 There is no evidence of an overlying cervical radiculopathy.  Jill Alexanders MD 10/26/2015 1:15 PM  [facility] [address] [place], [place] [address]  Phone [phone] Fax [phone]

## 2015-10-26 NOTE — Progress Notes (Signed)
Please call and advise the patient that the recent EMG and nerve conduction velocity test, which is the electrical nerve and muscle test we we performed, showed no evidence of any widespread neuropathy or nerve damage. No pinched nerve from the neck either. 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 Symptoms should improve with time and with therapy. I would recommend ongoing occupational therapy and I will see her back as previously scheduled.    Star Age, MD, PhD

## 2015-10-26 NOTE — Telephone Encounter (Signed)
I reviewed the OT notes, it looks like she made good progress with OT and achieved most of her goals. Unless recommended by OT for additional sessions, would recommend just home exercises as advised by OT. Please advise patient.

## 2015-10-26 NOTE — Progress Notes (Signed)
Please refer to EMG and nerve conduction study procedure note. 

## 2015-10-26 NOTE — Telephone Encounter (Signed)
I spoke to patient and she is aware of results and recommendations. She states that she just finished OT series. Patient asks if you advise that she continue with additional OT?

## 2015-10-26 NOTE — Telephone Encounter (Signed)
-----   Message from Star Age, MD sent at 10/26/2015  4:22 PM EDT ----- Please call and advise the patient that the recent EMG and nerve conduction velocity test, which is the electrical nerve and muscle test we we performed, showed no evidence of any widespread neuropathy or nerve damage. No pinched nerve from the neck either. 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 Symptoms should improve with time and with therapy. I would recommend ongoing occupational therapy and I will see her back as previously scheduled.    Star Age, MD, PhD

## 2015-10-26 NOTE — Telephone Encounter (Signed)
I spoke to patient and she is aware of recommendations below.

## 2015-10-27 ENCOUNTER — Encounter: Payer: Self-pay | Admitting: Pulmonary Disease

## 2015-10-27 ENCOUNTER — Other Ambulatory Visit: Payer: Medicare Other

## 2015-10-27 ENCOUNTER — Ambulatory Visit (INDEPENDENT_AMBULATORY_CARE_PROVIDER_SITE_OTHER): Payer: Medicare Other | Admitting: Pulmonary Disease

## 2015-10-27 ENCOUNTER — Encounter: Payer: Self-pay | Admitting: Occupational Therapy

## 2015-10-27 VITALS — BP 104/60 | HR 100 | Ht 68.0 in | Wt 152.2 lb

## 2015-10-27 DIAGNOSIS — F32A Depression, unspecified: Secondary | ICD-10-CM

## 2015-10-27 DIAGNOSIS — Z720 Tobacco use: Secondary | ICD-10-CM | POA: Diagnosis not present

## 2015-10-27 DIAGNOSIS — J449 Chronic obstructive pulmonary disease, unspecified: Secondary | ICD-10-CM

## 2015-10-27 DIAGNOSIS — J430 Unilateral pulmonary emphysema [MacLeod's syndrome]: Secondary | ICD-10-CM

## 2015-10-27 DIAGNOSIS — G4733 Obstructive sleep apnea (adult) (pediatric): Secondary | ICD-10-CM

## 2015-10-27 DIAGNOSIS — F329 Major depressive disorder, single episode, unspecified: Secondary | ICD-10-CM | POA: Insufficient documentation

## 2015-10-27 MED ORDER — ALBUTEROL SULFATE HFA 108 (90 BASE) MCG/ACT IN AERS: 2.0000 | INHALATION_SPRAY | Freq: Four times a day (QID) | RESPIRATORY_TRACT | 3 refills | Status: DC | PRN

## 2015-10-27 NOTE — Patient Instructions (Signed)
It was a pleasure taking care of you today!  You are diagnosed with Chronic Obstructive Pulmonary Disease or COPD.  COPD is a preventable and treatable disease that makes it difficult to empty air out of the lungs (airflow obstruction).  This can lead to shortness of breath.   Sometimes, when you have a lung infection, this can make your breathing worse, and will cause you to have a COPD flare-up or an acute exacerbation of COPD. Please call your primary care doctor or the office if you are having a COPD flare-up.   Smoking makes COPD worse.   Make sure you use your medications for COPD -- Maintenance medications : Spiriva respimat 2 puffs daily.   Rescue medications: Albuterol 2 puffs every 4 hours as needed for shortness of breath.   Please rinse your mouth each time you use your maintenance medication.  Please call the office if you are having issues with your medications  We will get a blood test for COPD.  We will give you PNA vaccine.   We extensively discussed the importance of treating OSA and the need to use PAP therapy.   Continue with CPAP 10 cm water.    Patient was instructed to have mask, tubings, filter, reservoir cleaned at least once a week with soapy water.  Patient was instructed to call the office if he/she is having issues with the PAP device.    I advised patient to obtain sufficient amount of sleep --  7 to 8 hours at least in a 24 hr period.  Patient was advised to follow good sleep hygiene.  Patient was advised NOT to engage in activities requiring concentration and/or vigilance if he/she is and  sleepy.  Patient is NOT to drive if he/she is sleepy.   Return to clinic in 6 weeks.

## 2015-10-27 NOTE — Assessment & Plan Note (Signed)
PFT (05/2015)  2.07  61%, DLCO 50 ABG (05/2015)  Normal Feels better with spiriva respimat. Still smokes cigarettes.  Plan : 1. Cont spiriva respimat 2. Alb MDI 2 p q 4 hrs prn 3. Got flu shot last week. Plan fro PNA 23 today. 4. Needs alpha one 5. Smoking cessation done.

## 2015-10-27 NOTE — Assessment & Plan Note (Signed)
Smoking cessation done.  

## 2015-10-27 NOTE — Progress Notes (Signed)
Subjective:    Patient ID: Judy Bailey, female    DOB: 1975-05-02, 40 y.o.   MRN: [id number]  HPI   This is the case of Judy Bailey, 40 y.o. Female, who was referred by Eldridge Abrahams and Dr. Star Age  in consultation regarding hypoxemia and OSA.   Patient was dxed with OSA in 03/2015. AHI 20. Started on CPAP 13 cm  In 04/2015. She feels pressure is too much. She ends up removing machine middle of the night. She uses O2 1L into machine.   15 PY smoking history, smokes 1/2 PPD.  Has heart issues 2/2 to HTN.  Has anxiety issues as well.  Has psych issues -- PTSD, Bipolar. Sees therapist weekly.   ROV 10/27/2015 Pt returns to the office as f/u on her OSA and COPD. Pt has missed several appointments since being placed on cpap.  She states today that she uses her cpap machine and she feels better using it. More energy. Less sleepiness.  She had several download compliances and those DL show she is hardly using her cpap. She states the DL are wrong and that she uses her cpap.  She got a new SD card 2 days ago.  She was also dxed with copd > started on spiriva.  Feels better with spiriva.  Can do ADLs w/o much issues. She continues to smoke cigarettes.  She continues to see her therapist for mental health issues.    Review of Systems  Constitutional: Negative.  Negative for fever and unexpected weight change.  HENT: Negative.  Negative for congestion, dental problem, ear pain, nosebleeds, postnasal drip, rhinorrhea, sinus pressure, sneezing, sore throat and trouble swallowing.   Eyes: Positive for redness and itching.  Respiratory: Positive for shortness of breath. Negative for cough, chest tightness and wheezing.   Cardiovascular: Positive for palpitations and leg swelling.  Gastrointestinal: Negative.  Negative for nausea and vomiting.  Endocrine: Positive for heat intolerance.  Genitourinary: Negative.  Negative for dysuria.  Musculoskeletal: Positive for arthralgias. Negative for joint  swelling.  Skin: Negative.  Negative for rash.  Allergic/Immunologic: Negative.   Neurological: Negative.  Negative for headaches.  Hematological: Negative.  Does not bruise/bleed easily.  Psychiatric/Behavioral: Negative.  Negative for dysphoric mood. The patient is not nervous/anxious.          Objective:   Physical Exam   Vitals:  Vitals:   10/27/15 1531  BP: 104/60  Pulse: 100  SpO2: 99%  Weight: 152 lb 3.2 oz (69 kg)  Height: 5\' 8"  (1.727 m)    Constitutional/General:  Pleasant, well-nourished, well-developed, not in any distress,  Comfortably seating.  Well kempt  Body mass index is 23.14 kg/m. Wt Readings from Last 3 Encounters:  10/27/15 152 lb 3.2 oz (69 kg)  09/28/15 162 lb (73.5 kg)  06/26/15 173 lb (78.5 kg)    HEENT: Pupils equal and reactive to light and accommodation. Anicteric sclerae. Normal nasal mucosa.   No oral  lesions,  mouth clear,  oropharynx clear, no postnasal drip. (-) Oral thrush. No dental caries.  Airway - Mallampati class II  Neck: No masses. Midline trachea. No JVD, (-) LAD. (-) bruits appreciated.  Respiratory/Chest: Grossly normal chest. (-) deformity. (-) Accessory muscle use.  Symmetric expansion. (-) Tenderness on palpation.  Resonant on percussion.  Diminished BS on both lower lung zones. (-) wheezing, crackles, rhonchi (-) egophony  Cardiovascular: Regular rate and  rhythm, heart sounds normal, no murmur or gallops, no peripheral edema  Gastrointestinal:  Normal bowel sounds. Soft, non-tender. No hepatosplenomegaly.  (-) masses.   Musculoskeletal:  Normal muscle tone. Normal gait.   Extremities: Grossly normal. (-) clubbing, cyanosis.  (-) edema  Skin: (-) rash,lesions seen.   Neurological/Psychiatric : alert, oriented to time, place, person. Normal mood and affect           Assessment & Plan:  Obstructive sleep apnea Pt with moderate OSA with sig o2 desatn. PSG (03/28/15) AHI 20, optimal on cpap 11 but  needed 1 L o2.  Neuro ordered cpap. She felt  pressure was too much.  We decreased pressure to cpap 10 cm water and she tolerates pressure.  She had a lab study in 06/2015 to prove the need for O2 > No O2 needed and optimal cpap was 10 cm water.   Pt has bipolar, anxiety, PTSD but she denies mask issues.   Pt has NOT been compliant based on DL.  DME gave her a new SD card 2 days ago. Pt states she uses cpap machine.  We told the pt to bring the machine to DME to make sure that machine is working fine and to make sure it gets accurate data re: compliance.   Plan :  We extensively discussed the importance of treating OSA and the need to use PAP therapy.   Continue with CPAP 10 cm water on RA.  SHE NEEDS TO PROVE GOOD COMPLIANCE FOR THE NEXT 3 DAYS OTHERWISE SHE WILL LOSE THE MACHINE AND WILL NEED TO START FROM SCRATCH. Pt understood this.  We told pt to bring machine to DME to have it checked so that we are sure its getting accurate reading of compliance.    Patient was instructed to have mask, tubings, filter, reservoir cleaned at least once a week with soapy water.  Patient was instructed to call the office if he/she is having issues with the PAP device.    I advised patient to obtain sufficient amount of sleep --  7 to 8 hours at least in a 24 hr period.  Patient was advised to follow good sleep hygiene.  Patient was advised NOT to engage in activities requiring concentration and/or vigilance if he/she is and  sleepy.  Patient is NOT to drive if he/she is sleepy.      COPD (chronic obstructive pulmonary disease) ([place]) PFT (05/2015)  2.07  61%, DLCO 50 ABG (05/2015)  Normal Feels better with spiriva respimat. Still smokes cigarettes.  Plan : 1. Cont spiriva respimat 2. Alb MDI 2 p q 4 hrs prn 3. Got flu shot last week. Plan fro PNA 23 today. 4. Needs alpha one 5. Smoking cessation done.   Tobacco user Smoking cessation done  Depression Pt with depression, anxiety, Bipolar,  PTSD. Sees therapist weekly. According to her, this is stable.     Patient will follow up with me in 6-8 weeks    J. Shirl Harris, MD 10/27/2015   11:25 PM Pulmonary and [place] Pager: [phone] Office: [phone], Fax: [phone]

## 2015-10-27 NOTE — Assessment & Plan Note (Signed)
Pt with depression, anxiety, Bipolar, PTSD. Sees therapist weekly. According to her, this is stable.

## 2015-10-27 NOTE — Assessment & Plan Note (Signed)
Pt with moderate OSA with sig o2 desatn. PSG (03/28/15) AHI 20, optimal on cpap 11 but needed 1 L o2.  Neuro ordered cpap. She felt  pressure was too much.  We decreased pressure to cpap 10 cm water and she tolerates pressure.  She had a lab study in 06/2015 to prove the need for O2 > No O2 needed and optimal cpap was 10 cm water.   Pt has bipolar, anxiety, PTSD but she denies mask issues.   Pt has NOT been compliant based on DL.  DME gave her a new SD card 2 days ago. Pt states she uses cpap machine.  We told the pt to bring the machine to DME to make sure that machine is working fine and to make sure it gets accurate data re: compliance.   Plan :  We extensively discussed the importance of treating OSA and the need to use PAP therapy.   Continue with CPAP 10 cm water on RA.  SHE NEEDS TO PROVE GOOD COMPLIANCE FOR THE NEXT 74 DAYS OTHERWISE SHE WILL LOSE THE MACHINE AND WILL NEED TO START FROM SCRATCH. Pt understood this.  We told pt to bring machine to DME to have it checked so that we are sure its getting accurate reading of compliance.    Patient was instructed to have mask, tubings, filter, reservoir cleaned at least once a week with soapy water.  Patient was instructed to call the office if he/she is having issues with the PAP device.    I advised patient to obtain sufficient amount of sleep --  7 to 8 hours at least in a 24 hr period.  Patient was advised to follow good sleep hygiene.  Patient was advised NOT to engage in activities requiring concentration and/or vigilance if he/she is and  sleepy.  Patient is NOT to drive if he/she is sleepy.

## 2015-11-02 LAB — ALPHA-1 ANTITRYPSIN PHENOTYPE: A1 ANTITRYPSIN: 137 mg/dL (ref 83–199)

## 2015-11-04 ENCOUNTER — Telehealth: Payer: Self-pay | Admitting: Pulmonary Disease

## 2015-11-04 ENCOUNTER — Telehealth: Payer: Self-pay | Admitting: *Deleted

## 2015-11-04 ENCOUNTER — Ambulatory Visit: Payer: Medicare Other | Attending: Family Medicine | Admitting: Occupational Therapy

## 2015-11-04 DIAGNOSIS — M6281 Muscle weakness (generalized): Secondary | ICD-10-CM

## 2015-11-04 DIAGNOSIS — G4733 Obstructive sleep apnea (adult) (pediatric): Secondary | ICD-10-CM

## 2015-11-04 DIAGNOSIS — M25641 Stiffness of right hand, not elsewhere classified: Secondary | ICD-10-CM | POA: Insufficient documentation

## 2015-11-04 DIAGNOSIS — R208 Other disturbances of skin sensation: Secondary | ICD-10-CM | POA: Diagnosis present

## 2015-11-04 NOTE — Telephone Encounter (Signed)
Attempted to contact patient, left message for patient to return call.

## 2015-11-04 NOTE — Patient Instructions (Addendum)
AROM: Wrist Extension perform for both arms   .  With _left___ palm down, bend wrist up. With water bottle or 1 lbs weight Repeat __1_ times per set.  Do __2__ sessions per day.      AROM: Wrist Flexion   With__left__ palm up, bend wrist up.with water bottle Repeat __10__ times per set.  Do _2_ sessions per day.   AROM: Forearm Pronation / Supination   With _left___ arm in handshake position, slowly rotate palm down until stretch is felt. Relax. Then rotate palm up until stretch is felt. Hold water bottle of 1 lbs weight Repeat _10__ times per set. Do _2__ sessions per day.  Copyright  VHI. All rights reserved.    1. Grip Strengthening (Resistive Putty) perform with green putty both hands   Squeeze putty using thumb and all fingers. Repeat _20___ times. Do __2__ sessions per day.   2. Roll putty into tube on table and pinch between each finger and thumb x 10 reps each. (can do ring and small finger together)perform with pink putty     Copyright  VHI. All rights reserved.     Perform prayer position for gentle stretch x 5-10 reps to stretch wrist in extension prior to other exercises 2x day

## 2015-11-04 NOTE — Therapy (Signed)
[facility] [address] [place], [place], [address] Phone: [phone]   Fax:  [phone]  Occupational Therapy Treatment  Patient Details  Name: Judy Bailey MRN: [id number] Date of Birth: 08-07-1975 Referring Provider: Dr. Precious Haws  Encounter Date: 11/04/2015      OT End of Session - 11/04/15 1631    Visit Number 7   Number of Visits 8   Authorization Type medicare - will need G code and PN every 10th visit   Authorization - Visit Number 8   Authorization - Number of Visits 10   OT Start Time 1535  d/c visit   OT Stop Time 1610   OT Time Calculation (min) 35 min   Activity Tolerance Patient tolerated treatment well   Behavior During Therapy [facility] for tasks assessed/performed      Past Medical History:  Diagnosis Date  . Absence of menstruation   . Agoraphobia with panic disorder   . Anxiety   . Attention deficit disorder without mention of hyperactivity   . Backache, unspecified   . Bipolar disorder ([place])   . Body mass index 30.0-30.9, adult   . Borderline personality disorder   . Depressive disorder, not elsewhere classified   . Diabetes mellitus without complication ([place])   . Disturbance of skin sensation   . Dyspepsia and other specified disorders of function of stomach   . Insomnia, unspecified   . Leukorrhea, not specified as infective   . Night terrors   . Other and unspecified hyperlipidemia   . Other nonspecific finding on examination of urine   . Other symptoms involving digestive system(787.99)   . Ovarian cancer ([place])   . Palpitations   . PTSD (post-traumatic stress disorder)   . 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 Restless legs syndrome (RLS)   . Tobacco use disorder   . Trichomonal vulvovaginitis   . Unspecified essential hypertension   . Unspecified sleep apnea     Past Surgical History:  Procedure Laterality Date  . KNEE CARTILAGE SURGERY     right  . tonsillectomy     . tympanic membrane  2009    There were no vitals filed for this visit.      Subjective Assessment - 11/05/15 1024    Subjective  Pt agrees with plans for discharge   Currently in Pain? No/denies         Pt arrived today without wrist braces. Her overall strength and A/ROM is improved. Therapist updated HEP for right hand and wrist strengthening, see pt instructions.(Pt demonstrates ability to perform wrist flexion and extension with 1 lbs weight) Putty for composite grip upgraded to green, red for tip pinch with RUE. Pt performed gentle wrist stretch in prayer position. Pt requested a new right wrist brace for sleep. Pt was issued a new wrist brace to wear at night only. Pt was encouraged to use her RUE for light functional activities at home, no heavy lifting. Pt agrees with plans for d/c and she will continue to exercise at home.(Pt had questions regarding a new diagnosis following the nerve  conduction study, pt was encouraged to request medical records from her MD regarding this diagnosis prn).   ROM A/ROM wrist flexion/ extension RUE :45/90, LUE :50/85 Grip strength:RUE 70 lbs, LUE 63 lbs                        OT Long Term Goals - 11/04/15 1631  OT LONG TERM GOAL #1   Title Pt will be mod I with HEP -  Pt will be mod I with updated HEP PRN.   Status Achieved  Pt was issued updated HEP for RUE strengthening     OT LONG TERM GOAL #2   Title Pt will be mod I with splint wear and care prn   Status Achieved     OT LONG TERM GOAL #3   Title Pt will demonstrate ability to button and tie, using splints as needed   Status Achieved     OT LONG TERM GOAL #4   Title Pt will be  mod I with simple hot meal prep   Status Achieved     OT LONG TERM GOAL #5   Title Pt will demonstrate ability for functional grip strength for basic ADL tasks   Status Achieved     OT LONG TERM GOAL #6   Title Pt will demonstrate ability to write at sentence level AE prn    Status Achieved               Plan - Nov 24, 2015 1628    Clinical Impression Statement Pt demonstrates excellent overall progress. Pt reports she believes that her right arm injury is as a result of sleeping on her right arm after taking strong sleep medication. Pt was instructed to contact her MD if she wishes to change sleep medications or lower the dosage.   Rehab Potential Fair   Clinical Impairments Affecting Rehab Potential etiology, inconsistent clinical presentation,    OT Frequency 2x / week   OT Duration 4 weeks   OT Treatment/Interventions Self-care/ADL training;Electrical Stimulation;DME and/or AE instruction;Neuromuscular education;Therapeutic exercise;Manual Therapy;Passive range of motion;Therapeutic activities;Patient/family education   Plan discharge occupational therapy, Pt was seen for 1 visit outside of orginal plan of care to update HEP after nerve conduction study.   Consulted and Agree with Plan of Care Patient      Patient will benefit from skilled therapeutic intervention in order to improve the following deficits and impairments:  Decreased range of motion, Decreased strength, Impaired perceived functional ability, Impaired UE functional use, Impaired sensation  Visit Diagnosis: Stiffness of right hand, not elsewhere classified  Muscle weakness (generalized)  Other disturbances of skin sensation      G-Codes - 11/24/2015 1655    Functional Assessment Tool Used dynamometer, RUE grip 70 lbs, LUE 63 lbs, clinical obsevation   Functional Limitation Carrying, moving and handling objects   Carrying, Moving and Handling Objects Goal Status (X0940) At least 40 percent but less than 60 percent impaired, limited or restricted   Carrying, Moving and Handling Objects Discharge Status [phone]) At least 1 percent but less than 20 percent impaired, limited or restricted     OCCUPATIONAL THERAPY DISCHARGE SUMMARY   Current functional level related to goals / functional  outcomes: Pt met all long term goals. Pt's functional abilities consistently exceeded pt's abilities demonstrated in therapy(ie: pt was driving and able to donn/ doff braces even when she was unable to perform A/ROM right wrist extension on command.)   She was seen for 1 additional visit to update HEP for RUE following nerve conduction study.   Remaining deficits: Mildly decreased right wrist extension. Pt reports right hand shakes with fatigue.    Education / Equipment: Pt was educated in the following:splint wear, care and precautions, HEP and safe wrist positioning. Pt verbalized understanding of all education. Plan: Patient agrees to discharge.  Patient goals were met. Patient  is being discharged due to meeting the stated rehab goals.  ?????          Problem List Patient Active Problem List   Diagnosis Date Noted  . COPD (chronic obstructive pulmonary disease) ([place]) 10/27/2015  . Depression 10/27/2015  . 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 Exertional dyspnea 05/18/2015  . Tobacco user 05/18/2015  . Hypoxemia 05/18/2015  . BV (bacterial vaginosis) 04/24/2013  . Mild dysplasia of cervix 04/24/2013  . Furuncle of buttock 04/09/2013  . Pain 04/09/2013  . Papanicolaou smear of cervix with low grade squamous intraepithelial lesion (LGSIL) 03/10/2013  . Essential (primary) hypertension 02/18/2013  . Postural tremor 06/05/2012  . 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 Hyperlipidemia 04/26/2012  . Obstructive sleep apnea 04/26/2012  . H/O knee surgery 04/26/2012  . H/O external ear surgery 04/26/2012    RINE,KATHRYN 11/05/2015, 10:27 AM  [facility] [address] [place], [place], [address] Phone: [phone]   Fax:  [phone]  Name: Judy Bailey MRN: [id number] Date of Birth: 04-Jun-1975

## 2015-11-05 ENCOUNTER — Encounter: Payer: Self-pay | Admitting: Neurology

## 2015-11-05 NOTE — Telephone Encounter (Signed)
Ok to order a sleep study.  If she has medicare or medicaid, pls do a split night study. Otherwise, if private, may do home sleep study.  I think the pt "might be mad at me or [place] or the DME" as she claims she uses cpap and we have said otherwise.  She may opt not to do anything about her cpap machine. Also, she wanted to get disabilty paper work from me for "OSA".   Thanks.   Gerrit Friends

## 2015-11-05 NOTE — Telephone Encounter (Signed)
I called patient. The patient requests a letter regarding the etiology of her radial neuropathy. This was never documented in her original visit note with Dr. Rexene Alberts. According to the patient, she was taking prazocin and Ambien together at the time that the radial neuropathies develop. The patient also has a prescription for alprazolam 2 mg tablets that she can take up to 3 times a day, but she claims that she was not taking this medication. I will dictate a letter documenting with the patient has told me.

## 2015-11-05 NOTE — Telephone Encounter (Signed)
LMOM TCB x2 for pt to get more info.  Since unable to speak with patient will call [facility] because her split night study was just in March 2017  [facility] and spoke with Amy.  Per Amy, because pt was noncompliant within the first 90 days of starting CPAP insurance requires she have a repeat sleep study to continue paying for machine.  Pt did bring her machine in to [facility] yesterday 10.12.17 (as advised to patient in the 10.5.17 visit with AD) and [facility] verified that her machine is working properly.  Pt also mentioned in that office visit that was wearing her CPAP daily, though her DL showed otherwise.  She received a new SD card 2 days prior to that visit.  Per Amy, despite pt's claim that she was wearing her CPAP and statement that the DL was inaccurate, she will still need a repeat study.  AD please advise if okay to order another sleep study for pt.  Thank you.

## 2015-11-08 NOTE — Telephone Encounter (Signed)
Mailed signed letter to patient from [facility], MD.

## 2015-11-08 NOTE — Telephone Encounter (Signed)
lmtcb x1 for pt. 

## 2015-11-09 NOTE — Telephone Encounter (Signed)
Called spoke with pt. Reviewed AD's recs. She approved of having HST done. Order has been placed. She voiced understanding and had no further questions. Order placed. Nothing further needed.

## 2015-11-09 NOTE — Telephone Encounter (Signed)
Pt returning call.Judy Bailey ° °

## 2015-11-11 ENCOUNTER — Encounter: Payer: Self-pay | Admitting: Occupational Therapy

## 2015-11-19 ENCOUNTER — Encounter: Payer: Self-pay | Admitting: Occupational Therapy

## 2015-11-24 DIAGNOSIS — G4733 Obstructive sleep apnea (adult) (pediatric): Secondary | ICD-10-CM | POA: Diagnosis not present

## 2015-12-03 ENCOUNTER — Other Ambulatory Visit: Payer: Self-pay | Admitting: *Deleted

## 2015-12-03 ENCOUNTER — Telehealth: Payer: Self-pay | Admitting: Pulmonary Disease

## 2015-12-03 DIAGNOSIS — G4733 Obstructive sleep apnea (adult) (pediatric): Secondary | ICD-10-CM

## 2015-12-03 NOTE — Telephone Encounter (Signed)
  Please call the pt and tell the pt the [place]  showed OSA .   Pt stops breathing 9   times an hour.   Home sleep study was done on : 11/24/15  Please order autoCPAP 5-15 cm H2O. Patient will need a mask fitting session. Patient will need a 1 month download.   Patient needs to be seen by me or any of the NPs/APPs  4-6 weeks after obtaining the cpap machine. Let me know if you receive this.   Thanks!   J. Shirl Harris, MD 12/03/2015, 8:53 AM

## 2015-12-06 ENCOUNTER — Other Ambulatory Visit: Payer: Self-pay

## 2015-12-06 DIAGNOSIS — G4733 Obstructive sleep apnea (adult) (pediatric): Secondary | ICD-10-CM

## 2015-12-06 NOTE — Telephone Encounter (Signed)
Called and spoke with pt. And gave her Dr. Corrie Dandy message about her study, pt. Expressed she understood and stated it was ok to place the order for the CPAP. She also stated she will give [place] a call for a follow up appt. Once she is set up.  The order has been placed nothing further is needed.

## 2015-12-08 ENCOUNTER — Telehealth: Payer: Self-pay | Admitting: Pulmonary Disease

## 2015-12-08 DIAGNOSIS — G4733 Obstructive sleep apnea (adult) (pediatric): Secondary | ICD-10-CM

## 2015-12-08 NOTE — Telephone Encounter (Signed)
Called and spoke to [place] with [facility]. Corene Cornea stated they are unable loan the pt a auto titrating CPAP due to her noncompliance. [facility] can change her pressure to 10 (this is the last pressure setting from 04/2015) on her machine and if she is compliant for 1 month then do an OV showing her compliance then [facility] can loan the pt an auto titrating CPAP.  [facility] is unable to loan out any machine if the pt is not compliant because of billing the insurance.   Dr. Corrie Dandy please advise if you would like to change the pressure then set up an OV in 1 month. Thanks.

## 2015-12-09 NOTE — Telephone Encounter (Signed)
   I saw the pt on 10/27/2015.  She was non compliant. Pt had a HST in 11/2015 to start the process again with getting a cpap 2/2 non compliance.  Since she repeated the sleep study in 11/2015, can she not get an autocpap machine?  If not, then OK with cpap 10 cm water.  pls let me know what DME will decide.   Thanks.  Monica Becton, MD 12/09/2015, 6:04 AM [facility] Pager [phone] After 3 pm or if no answer, call [phone]

## 2015-12-09 NOTE — Telephone Encounter (Signed)
The number provided in the message went to [place] who is not with this area [place] Was transferred Corene Cornea is offline, per Colletta [place] - message was left for him to call the office back  AD asks if pt can not just get an autopap, but per Corene Cornea she cannot until her compliance is proven.  Would like to verify this one more time w/ Corene Cornea

## 2015-12-10 ENCOUNTER — Ambulatory Visit: Payer: Self-pay | Admitting: Pulmonary Disease

## 2015-12-10 NOTE — Telephone Encounter (Signed)
Lm for [facility] with [facility]  Will await call back.

## 2015-12-13 NOTE — Telephone Encounter (Signed)
lmtcb for Judy Bailey at [facility] 

## 2015-12-14 NOTE — Addendum Note (Signed)
Addended by: Parke Poisson E on: 12/14/2015 12:13 PM   Modules accepted: Orders

## 2015-12-14 NOTE — Telephone Encounter (Signed)
Judy Bailey w/ [facility] returned call He verified that insurance dictates that d/t pt's previous noncompliance, they cannot do an autopap without 30 days of documented compliance.  Pt will need to start with set pressure.  Judy Bailey stated that he has already spoken with patient, explained the situation to her and she is okay with starting CPAP at 10cm for now.  Because this was already okayed by AD, will go ahead and place order Nothing further needed Will sign and forward to AD to make him aware

## 2015-12-14 NOTE — Telephone Encounter (Signed)
See 11.15.17 phone note Will sign off

## 2015-12-28 ENCOUNTER — Ambulatory Visit: Payer: Medicare Other | Admitting: Neurology

## 2016-01-31 ENCOUNTER — Ambulatory Visit: Payer: Medicare Other | Admitting: Neurology

## 2016-01-31 ENCOUNTER — Other Ambulatory Visit: Payer: Self-pay

## 2016-01-31 ENCOUNTER — Telehealth: Payer: Self-pay

## 2016-01-31 MED ORDER — TIOTROPIUM BROMIDE MONOHYDRATE 2.5 MCG/ACT IN AERS: 2.0000 | INHALATION_SPRAY | Freq: Every day | RESPIRATORY_TRACT | 5 refills | Status: DC

## 2016-01-31 NOTE — Telephone Encounter (Signed)
Pt no-showed her appt this afternoon. 

## 2016-02-01 ENCOUNTER — Encounter: Payer: Self-pay | Admitting: Neurology

## 2016-02-04 ENCOUNTER — Other Ambulatory Visit: Payer: Self-pay

## 2016-02-04 MED ORDER — TIOTROPIUM BROMIDE MONOHYDRATE 2.5 MCG/ACT IN AERS
2.0000 | INHALATION_SPRAY | Freq: Every day | RESPIRATORY_TRACT | 5 refills | Status: DC
Start: 2016-02-04 — End: 2018-01-02

## 2016-03-01 ENCOUNTER — Other Ambulatory Visit: Payer: Self-pay

## 2016-03-01 DIAGNOSIS — G4733 Obstructive sleep apnea (adult) (pediatric): Secondary | ICD-10-CM

## 2016-03-28 ENCOUNTER — Other Ambulatory Visit: Payer: Self-pay

## 2016-03-28 DIAGNOSIS — G4733 Obstructive sleep apnea (adult) (pediatric): Secondary | ICD-10-CM

## 2016-05-16 ENCOUNTER — Encounter (HOSPITAL_COMMUNITY): Payer: Self-pay | Admitting: Emergency Medicine

## 2016-05-16 ENCOUNTER — Emergency Department (HOSPITAL_COMMUNITY): Payer: Medicare Other

## 2016-05-16 ENCOUNTER — Emergency Department (HOSPITAL_COMMUNITY)
Admission: EM | Admit: 2016-05-16 | Discharge: 2016-05-16 | Disposition: A | Discharge status: Home / Self Care | Payer: Medicare Other | Attending: Physician Assistant | Admitting: Physician Assistant

## 2016-05-16 DIAGNOSIS — E119 Type 2 diabetes mellitus without complications: Secondary | ICD-10-CM | POA: Insufficient documentation

## 2016-05-16 DIAGNOSIS — F17210 Nicotine dependence, cigarettes, uncomplicated: Secondary | ICD-10-CM | POA: Insufficient documentation

## 2016-05-16 DIAGNOSIS — J449 Chronic obstructive pulmonary disease, unspecified: Secondary | ICD-10-CM | POA: Insufficient documentation

## 2016-05-16 DIAGNOSIS — R451 Restlessness and agitation: Secondary | ICD-10-CM | POA: Diagnosis present

## 2016-05-16 DIAGNOSIS — F69 Unspecified disorder of adult personality and behavior: Secondary | ICD-10-CM | POA: Insufficient documentation

## 2016-05-16 DIAGNOSIS — F909 Attention-deficit hyperactivity disorder, unspecified type: Secondary | ICD-10-CM | POA: Diagnosis not present

## 2016-05-16 DIAGNOSIS — Z79899 Other long term (current) drug therapy: Secondary | ICD-10-CM | POA: Diagnosis not present

## 2016-05-16 LAB — CBC WITH DIFFERENTIAL/PLATELET
BASOS PCT: 0 %
Basophils Absolute: 0 10*3/uL (ref 0.0–0.1)
Eosinophils Absolute: 0 10*3/uL (ref 0.0–0.7)
Eosinophils Relative: 0 %
HEMATOCRIT: 41 % (ref 36.0–46.0)
HEMOGLOBIN: 14.6 g/dL (ref 12.0–15.0)
LYMPHS ABS: 1 10*3/uL (ref 0.7–4.0)
Lymphocytes Relative: 11 %
MCH: 32.7 pg (ref 26.0–34.0)
MCHC: 35.6 g/dL (ref 30.0–36.0)
MCV: 91.9 fL (ref 78.0–100.0)
MONO ABS: 0.3 10*3/uL (ref 0.1–1.0)
MONOS PCT: 3 %
NEUTROS ABS: 7.7 10*3/uL (ref 1.7–7.7)
NEUTROS PCT: 86 %
Platelets: 254 10*3/uL (ref 150–400)
RBC: 4.46 MIL/uL (ref 3.87–5.11)
RDW: 12.3 % (ref 11.5–15.5)
WBC: 9 10*3/uL (ref 4.0–10.5)

## 2016-05-16 LAB — SALICYLATE LEVEL: Salicylate Lvl: <7 mg/dL (ref 2.8–30.0)

## 2016-05-16 LAB — COMPREHENSIVE METABOLIC PANEL
ALBUMIN: 3.9 g/dL (ref 3.5–5.0)
ALT: 22 U/L (ref 14–54)
ANION GAP: 10 (ref 5–15)
AST: 29 U/L (ref 15–41)
Alkaline Phosphatase: 64 U/L (ref 38–126)
BUN: 10 mg/dL (ref 6–20)
CHLORIDE: 110 mmol/L (ref 101–111)
CO2: 20 mmol/L — ABNORMAL LOW (ref 22–32)
Calcium: 9 mg/dL (ref 8.9–10.3)
Creatinine, Ser: 0.96 mg/dL (ref 0.44–1.00)
Glucose, Bld: 134 mg/dL — ABNORMAL HIGH (ref 65–99)
POTASSIUM: 3.2 mmol/L — AB (ref 3.5–5.1)
Sodium: 140 mmol/L (ref 135–145)
Total Bilirubin: 1.1 mg/dL (ref 0.3–1.2)
Total Protein: 7 g/dL (ref 6.5–8.1)

## 2016-05-16 LAB — RAPID URINE DRUG SCREEN, HOSP PERFORMED
AMPHETAMINES: NOT DETECTED
Barbiturates: NOT DETECTED
Benzodiazepines: POSITIVE — AB
COCAINE: NOT DETECTED
OPIATES: POSITIVE — AB
TETRAHYDROCANNABINOL: NOT DETECTED

## 2016-05-16 LAB — ETHANOL: Alcohol, Ethyl (B): <5 mg/dL (ref <5)

## 2016-05-16 LAB — ACETAMINOPHEN LEVEL: Acetaminophen (Tylenol), Serum: <10 ug/mL — ABNORMAL LOW (ref 10–30)

## 2016-05-16 LAB — HCG, SERUM, QUALITATIVE: Preg, Serum: NEGATIVE

## 2016-05-16 MED ORDER — ZIPRASIDONE MESYLATE 20 MG IM SOLR
20.0000 mg | Freq: Once | INTRAMUSCULAR | Status: AC
  Administered 2016-05-16: 20 mg via INTRAMUSCULAR
  Filled 2016-05-16: qty 20

## 2016-05-16 MED ORDER — STERILE WATER FOR INJECTION IJ SOLN
INTRAMUSCULAR | Status: AC
  Administered 2016-05-16: 10 mL
  Filled 2016-05-16: qty 10

## 2016-05-16 MED ORDER — LORAZEPAM 2 MG/ML IJ SOLN
2.0000 mg | Freq: Once | INTRAMUSCULAR | Status: AC
  Administered 2016-05-16: 2 mg via INTRAMUSCULAR
  Filled 2016-05-16: qty 1

## 2016-05-16 NOTE — BH Assessment (Signed)
Assessment Note  Judy Bailey is an 41 y.o. female with history of Borderline Personality Disorder, Anxiety, PTSD, and Depression. The mother contacted 911 after she thought that patient overdosed on cough syrup. Patient denies that she overdosed. Sts that she took a therapeutic amount. Patient denies current suicidal ideations. She denies history of suicide attempts or gestures. She does admit to depressive symptoms including loss of interest in usual pleasures, fatigue, agitation, and hopelessness. and She denies HI. Patient has court date May 23, 2016. Patient did not disclose criminal charges. No AVH's. No history of INPT treatment. [name] psychiatrist is Dr. Wonda Amis. She has a therapist "Melonie". She also seeks Suboxone treatment but doesn't recall the name of the provider. UDS is positive for Benzodiazepines and Opiates. Patient is dressed in scrubs. She is restless with hyperactivity. Speech is pressured. Affect is blunted. Oriented to person, place, time, and situation.   Diagnosis: Major Depressive Disorder, Recurrent, Severe, without psychotic features    Past Medical History:  Past Medical History:  Diagnosis Date  . Absence of menstruation   . Agoraphobia with panic disorder   . Anxiety   . Attention deficit disorder without mention of hyperactivity   . Backache, unspecified   . Bipolar disorder ([place])   . Body mass index 30.0-30.9, adult   . Borderline personality disorder   . Depressive disorder, not elsewhere classified   . Diabetes mellitus without complication ([place])   . Disturbance of skin sensation   . Dyspepsia and other specified disorders of function of stomach   . Insomnia, unspecified   . Leukorrhea, not specified as infective   . Night terrors   . Other and unspecified hyperlipidemia   . Other nonspecific finding on examination of urine   . Other symptoms involving digestive system(787.99)   . Ovarian cancer ([place])   . Palpitations   . PTSD (post-traumatic  stress disorder)   . 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 Restless legs syndrome (RLS)   . Tobacco use disorder   . Trichomonal vulvovaginitis   . Unspecified essential hypertension   . Unspecified sleep apnea     Past Surgical History:  Procedure Laterality Date  . KNEE CARTILAGE SURGERY     right  . tonsillectomy    . tympanic membrane  2009    Family History:  Family History  Problem Relation Age of Onset  . Hypertension Father   . Diabetes Father   . Heart disease Father   . Depression Father   . Colon polyps Father   . Other Father     mad cow disease  . Colon polyps Mother   . Kidney disease Mother   . Irritable bowel syndrome Paternal Aunt   . Bipolar disorder Paternal Aunt   . Prostate cancer Maternal Grandfather   . Liver disease Maternal Grandfather   . Breast cancer Paternal Grandmother   . Diabetes Paternal Grandmother   . Bipolar disorder Paternal Grandmother   . Alzheimer's disease Paternal Grandfather   . Parkinson's disease Paternal Grandfather   . Hypertension Brother   . Bipolar disorder Sister   . Colon polyps Brother   . Clotting disorder Maternal Grandmother   . Irritable bowel syndrome Sister   . Bipolar disorder Cousin     Social History:  reports that she has been smoking Cigarettes.  She has a 22.00 pack-year smoking history. She has never used smokeless tobacco. She reports that she does not drink alcohol or use drugs.  Additional Social History:  Alcohol /  Drug Use Pain Medications: SEE MAR Prescriptions: SEE MAR Over the Counter: SEE MAR History of alcohol / drug use?: Yes Substance #1 Name of Substance 1: UDS positive for Opiates; patient denies use during TTS assessment; patient reported to nursing staff that she used Heroin 2 weeks ago. 1 - Age of First Use: unk 1 - Amount (size/oz): unk 1 - Frequency: unk 1 - Duration: unk 1 - Last Use / Amount: unk Substance #2 Name of Substance 2: UDS is positive for Benzo's; patient  denies use 2 - Age of First Use: unk 2 - Amount (size/oz): unk 2 - Frequency: unk 2 - Duration: unk 2 - Last Use / Amount: unk Substance #3 Name of Substance 3: Suboxone; Patient doesn't know recall where she receives suboxone services.  3 - Age of First Use: unk 3 - Amount (size/oz): unk 3 - Frequency: unk 3 - Duration: unk 3 - Last Use / Amount: unk  CIWA: CIWA-Ar BP: 112/90 Pulse Rate: (!) 108 COWS:    Allergies: No Known Allergies  Home Medications:  (Not in a hospital admission)  OB/GYN Status:  No LMP recorded. Patient has had an injection.  General Assessment Data Location of Assessment: [facility] TTS Assessment: In system Is this a Tele or Face-to-Face Assessment?: Face-to-Face Is this an Initial Assessment or a Re-assessment for this encounter?: Initial Assessment Marital status: Single Maiden name:  (n/a) Is patient pregnant?: No Pregnancy Status: No Living Arrangements: Parent Can pt return to current living arrangement?: Yes Admission Status: Voluntary Is patient capable of signing voluntary admission?: Yes Referral Source: Self/Family/Friend Insurance type:  (Medicaid )     Crisis Care Plan Living Arrangements: Parent Legal Guardian: Other: (no legal guardian ) Name of Psychiatrist:  (Dr. Rae Mar) Name of Therapist:  ("Melonie something.Marland KitchenMarland KitchenI can't remember her full name")  Education Status Is patient currently in school?: No Current Grade:  (n/a) Highest grade of school patient has completed: unk Name of school: n/a Contact person:  (n/a)  Risk to self with the past 6 months Suicidal Ideation: No Has patient been a risk to self within the past 6 months prior to admission? : No Suicidal Intent: No Has patient had any suicidal intent within the past 6 months prior to admission? : No Is patient at risk for suicide?: No Suicidal Plan?: No Has patient had any suicidal plan within the past 6 months prior to admission? : No Access to Means:  No What has been your use of drugs/alcohol within the last 12 months?:  (currently taking suboxone; UDS + Opiates and Benzo's) Previous Attempts/Gestures: No How many times?:  (n/a) Other Self Harm Risks:  (denies self harm risk ) Triggers for Past Attempts: Other (Comment) (patient denies prior attempts or gestures ) Intentional Self Injurious Behavior: None Family Suicide History: No Persecutory voices/beliefs?: No Depression: Yes Depression Symptoms: Feeling angry/irritable, Feeling worthless/self pity, Loss of interest in usual pleasures, Isolating, Fatigue, Guilt Substance abuse history and/or treatment for substance abuse?: No Suicide prevention information given to non-admitted patients: Not applicable  Risk to Others within the past 6 months Homicidal Ideation: No Does patient have any lifetime risk of violence toward others beyond the six months prior to admission? : No Thoughts of Harm to Others: No Current Homicidal Intent: No Current Homicidal Plan: No Access to Homicidal Means: No Identified Victim:  (n/a) History of harm to others?: No Assessment of Violence: None Noted Violent Behavior Description:  (patient is currently calm and cooperative ') Does patient have  access to weapons?: No Criminal Charges Pending?: Yes Describe Pending Criminal Charges:  (unknown ) Does patient have a court date: Yes (March 23, 2016) Court Date:  (March 23, 2016) Is patient on probation?: No  Psychosis Hallucinations: None noted Delusions: None noted  Mental Status Report Appearance/Hygiene: Disheveled, Poor hygiene Eye Contact: Fair Motor Activity: Agitation, Hyperactivity, Restlessness Speech: Pressured, Rapid Level of Consciousness: Restless Mood: Anxious Affect: Irritable, Anxious Anxiety Level: Panic Attacks Panic attack frequency:  (daily ) Most recent panic attack:  (05/16/2016) Thought Processes: Relevant Judgement: Unimpaired Orientation: Person, Place, Time,  Situation Obsessive Compulsive Thoughts/Behaviors: None  Cognitive Functioning Concentration: Decreased Memory: Recent Intact, Remote Intact IQ: Average Insight: Poor Impulse Control: Poor Appetite: Fair Weight Loss:  (none reported) Weight Gain:  (none reported) Sleep: Decreased Total Hours of Sleep:  (varies; 4 to 5 hrs per night ) Vegetative Symptoms: None  ADLScreening [facility] Assessment Services) [name] cognitive ability adequate to safely complete daily activities?: Yes Patient able to express need for assistance with ADLs?: Yes Independently performs ADLs?: Yes (appropriate for developmental age)  Prior Inpatient Therapy Prior Inpatient Therapy: No Prior Therapy Dates:  (n/a) Prior Therapy Facilty/Provider(s):  (n/a) Reason for Treatment:  (n/a)  Prior Outpatient Therapy Prior Outpatient Therapy: Yes Prior Therapy Dates:  (current) Prior Therapy Facilty/Provider(s):  (Dr. Wonda Amis, "Melonie something I can't recall her name", Sub) Reason for Treatment:  (med managment, therapy, Suboxone clinic ) Does patient have an ACCT team?: No Does patient have Intensive In-House Services?  : No Does patient have [facility] services? : No Does patient have P4CC services?: No  ADL Screening (condition at time of admission) [name] cognitive ability adequate to safely complete daily activities?: Yes Patient able to express need for assistance with ADLs?: Yes Independently performs ADLs?: Yes (appropriate for developmental age)             Regulatory affairs officer (For Healthcare) Does Patient Have a Medical Advance Directive?: No Would patient like information on creating a medical advance directive?: No - Patient declined    Additional Information 1:1 In Past 12 Months?: No CIRT Risk: No Elopement Risk: No Does patient have medical clearance?: Yes     Disposition:  Disposition Initial Assessment Completed for this Encounter: Yes Disposition of Patient: Other  dispositions (Per Waylan Boga, DNP discharge to current provider(s)) Other disposition(s): Information only  On Site Evaluation by:   Reviewed with Physician:    Waldon Merl 05/16/2016 7:19 PM

## 2016-05-16 NOTE — ED Notes (Signed)
Patient refuses to keep EKG leads for cardiac monitoring.

## 2016-05-16 NOTE — ED Provider Notes (Signed)
[facility] DEPT Provider Note   CSN: [id number] Arrival date & time: 05/16/16  1433     History   Chief Complaint Chief Complaint  Patient presents with  . Ingestion  . restless    HPI Judy Bailey is a 41 y.o. female.  HPI   Patient is a 41 year old female presenting with agitation. Patient lives with mother. She reports being on Suboxone. She reports taking this normally. Patient appears unkempt. [name] mom says that she took some cold medicine. Patient states it was only 1 dose of NyQuil. Mom agrees. [name] mom says this started roughly when the Court called her to tell her to make sure that she needed to be in court on Monday.  [name] agitated but completely redirectable. Is moving all 4 limbs. Mom states that she is having ""jerks".   Past Medical History:  Diagnosis Date  . Absence of menstruation   . Agoraphobia with panic disorder   . Anxiety   . Attention deficit disorder without mention of hyperactivity   . Backache, unspecified   . Bipolar disorder ([place])   . Body mass index 30.0-30.9, adult   . Borderline personality disorder   . Depressive disorder, not elsewhere classified   . Diabetes mellitus without complication ([place])   . Disturbance of skin sensation   . Dyspepsia and other specified disorders of function of stomach   . Insomnia, unspecified   . Leukorrhea, not specified as infective   . Night terrors   . Other and unspecified hyperlipidemia   . Other nonspecific finding on examination of urine   . Other symptoms involving digestive system(787.99)   . Ovarian cancer ([place])   . Palpitations   . PTSD (post-traumatic stress disorder)   . 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 Restless legs syndrome (RLS)   . Tobacco use disorder   . Trichomonal vulvovaginitis   . Unspecified essential hypertension   . Unspecified sleep apnea     Patient Active Problem List   Diagnosis Date Noted  . COPD (chronic obstructive pulmonary disease) ([place])  10/27/2015  . Depression 10/27/2015  . 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 Exertional dyspnea 05/18/2015  . Tobacco user 05/18/2015  . Hypoxemia 05/18/2015  . BV (bacterial vaginosis) 04/24/2013  . Mild dysplasia of cervix 04/24/2013  . Furuncle of buttock 04/09/2013  . Pain 04/09/2013  . Papanicolaou smear of cervix with low grade squamous intraepithelial lesion (LGSIL) 03/10/2013  . Essential (primary) hypertension 02/18/2013  . Postural tremor 06/05/2012  . 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 Hyperlipidemia 04/26/2012  . Obstructive sleep apnea 04/26/2012  . H/O knee surgery 04/26/2012  . H/O external ear surgery 04/26/2012    Past Surgical History:  Procedure Laterality Date  . KNEE CARTILAGE SURGERY     right  . tonsillectomy    . tympanic membrane  2009    OB History    Gravida Para Term Preterm AB Living   1 1 1     1    SAB TAB Ectopic Multiple Live Births           1       Home Medications    Prior to Admission medications   Medication Sig Start Date End Date Taking? Authorizing Provider  albuterol (PROVENTIL HFA;VENTOLIN HFA) 108 (90 Base) MCG/ACT inhaler Inhale 2 puffs into the lungs every 6 (six) hours as needed for wheezing or shortness of breath. 10/27/15   Jose Shirl Harris, MD  alprazolam Duanne Moron) 2 MG tablet Take 2 mg by  mouth 3 (three) times daily as needed for sleep.     Historical Provider, MD  amphetamine-dextroamphetamine (ADDERALL XR) 30 MG 24 hr capsule Take 30 mg by mouth daily.  10/17/13   Historical Provider, MD  amphetamine-dextroamphetamine (ADDERALL) 30 MG tablet Take 30 mg by mouth daily.     Historical Provider, MD  cyanocobalamin (,VITAMIN B-12,) 1000 MCG/ML injection Inject into the muscle. 02/09/15   Historical Provider, MD  desvenlafaxine (PRISTIQ) 100 MG 24 hr tablet Take 1 tablet (100 mg total) by mouth daily. 09/03/12   Kathlee Nations, MD  omeprazole (PRILOSEC) 20 MG capsule Take 20 mg by mouth daily.    Historical Provider, MD  Prenatal  MV-Min-Fe Fum-FA-DHA (PRENATAL 1 PO) Take by mouth.    Historical Provider, MD  propranolol ER (INDERAL LA) 120 MG 24 hr capsule Take 120 mg by mouth daily.    Historical Provider, MD  Tiotropium Bromide Monohydrate (SPIRIVA RESPIMAT) 2.5 MCG/ACT AERS Inhale 2 puffs into the lungs daily. 02/04/16   [place], MD  zolpidem (AMBIEN) 10 MG tablet Take 20 mg by mouth at bedtime as needed for sleep.     Historical Provider, MD    Family History Family History  Problem Relation Age of Onset  . Hypertension Father   . Diabetes Father   . Heart disease Father   . Depression Father   . Colon polyps Father   . Other Father     mad cow disease  . Colon polyps Mother   . Kidney disease Mother   . Irritable bowel syndrome Paternal Aunt   . Bipolar disorder Paternal Aunt   . Prostate cancer Maternal Grandfather   . Liver disease Maternal Grandfather   . Breast cancer Paternal Grandmother   . Diabetes Paternal Grandmother   . Bipolar disorder Paternal Grandmother   . Alzheimer's disease Paternal Grandfather   . Parkinson's disease Paternal Grandfather   . Hypertension Brother   . Bipolar disorder Sister   . Colon polyps Brother   . Clotting disorder Maternal Grandmother   . Irritable bowel syndrome Sister   . Bipolar disorder Cousin     Social History Social History  Substance Use Topics  . Smoking status: Current Every Day Smoker    Packs/day: 1.00    Years: 22.00    Types: Cigarettes  . Smokeless tobacco: Never Used  . Alcohol use No     Allergies   Patient has no known allergies.   Review of Systems Review of Systems  Unable to perform ROS: Mental status change  Constitutional: Positive for activity change.  Psychiatric/Behavioral: Positive for agitation, behavioral problems and confusion.     Physical Exam Updated Vital Signs BP (!) 130/94 (BP Location: Left Arm)   Pulse 89   Temp 98.1 F (36.7 C) (Oral)   Resp (!) 28   SpO2 100%   Physical Exam    Constitutional: She appears well-developed and well-nourished.  HENT:  Head: Normocephalic and atraumatic.  Eyes: Right eye exhibits no discharge. Left eye exhibits no discharge.  Cardiovascular: Normal rate.   Pulmonary/Chest: Effort normal.  Neurological: No cranial nerve deficit.  Mid sized pupils. Patient able tomove all 4 extremities, a/o X 3  Skin: Skin is warm and dry. She is not diaphoretic.  Psychiatric:  Pt acting bizarrelly. No SI/HI.   Nursing note and vitals reviewed.    ED Treatments / Results  Labs (all labs ordered are listed, but only abnormal results are displayed) Labs Reviewed  CBC WITH DIFFERENTIAL/PLATELET  COMPREHENSIVE METABOLIC PANEL  RAPID URINE DRUG SCREEN, HOSP PERFORMED  ETHANOL  SALICYLATE LEVEL  ACETAMINOPHEN LEVEL  HCG, SERUM, QUALITATIVE    EKG  EKG Interpretation None       Radiology No results found.  Procedures Procedures (including critical care time)  Medications Ordered in ED Medications  ziprasidone (GEODON) injection 20 mg (not administered)     Initial Impression / Assessment and Plan / ED Course  I have reviewed the triage vital signs and the nursing notes.  Pertinent labs & imaging results that were available during my care of the patient were reviewed by me and considered in my medical decision making (see chart for details).     Patient is a 41 year old female presenting with agitation. It does not count of the patient had any actual ingestion. She reports taking one cold medicine. Part of the behavior seems volitional. We will give her Geodon and have TTS reassess as well as get labs.  Patient is given 20 of Geodon. After half an hour patient was still restless and difficult to redirect. 2 g of IM Ativan given. Patient then became very compliant. Labs were drawn. Patient seen by TTS and they do not believe that she meets any requirement.  Patient now calm and is alert and oriented 3.   [name] mom feels  comfortable taking her home now. We will discharge her home with mom.    Final Clinical Impressions(s) / ED Diagnoses   Final diagnoses:  None    New Prescriptions New Prescriptions   No medications on file     Chord Takahashi Julio Alm, MD 05/16/16 1928

## 2016-05-16 NOTE — Discharge Instructions (Signed)
Please return with any feelings of wanting to hurt yourself or others.

## 2016-05-16 NOTE — ED Notes (Signed)
White tablets in ziplock bag are alprazolam according to pill identifier website.

## 2016-05-16 NOTE — ED Notes (Signed)
Patient very restless and agitated. Continuously moves around and cardiac monitor unable to stay hooked up.

## 2016-05-16 NOTE — ED Notes (Signed)
Sitter came out stating that Judy Bailey is very anxious and wanting to know plan of care because she really wants to go home.   Made Dr Thomasene Lot aware.

## 2016-05-16 NOTE — ED Notes (Signed)
Pt ambulatory and independent at discharge.  Verbalized understanding of discharge instructions 

## 2016-05-16 NOTE — ED Notes (Signed)
Judy Bailey with TTS came out of patient's room stating that she doesn't feel patient needs any in patient treatment.

## 2016-05-16 NOTE — ED Notes (Signed)
Bed: TR32 Expected date:  Expected time:  Means of arrival:  Comments: EMS-OD/agitated

## 2016-05-16 NOTE — ED Triage Notes (Addendum)
Per [facility] pt from home for possible overdose on cold medication per patient's mother who called 52.  [name] mother reported giving one spray of narcan to see if would help with patient's abnormal activity.  Patient takes Suboxone normally everyday but states that she didn't take it until later this afternoon.  Patient was given Midazlopram 5mg  in route with EMS. Patient very restless and anxious and diaphoretic.  Patient denies taking cold medication.  Patient denies SI or HI. Patient states that last time she did heroin was 2 weeks ago.  EMS brought in one pill bottle of her Suboxone, one week pill organizer that has pills in them, and ziplock bag that has white tablets in them that patient's friend gave to patient.   Patient

## 2016-05-16 NOTE — ED Notes (Signed)
ED Provider at bedside. 

## 2016-05-16 NOTE — ED Notes (Signed)
Sitter at bedside.

## 2016-05-16 NOTE — ED Notes (Signed)
Made patient and mother aware that medication should hopefully help relax patient in about 20-30 minutes.  When patient is more relaxed and calm, staff will have to obtain blood work.

## 2016-05-16 NOTE — ED Notes (Signed)
Patient transported to X-ray 

## 2016-05-16 NOTE — ED Notes (Signed)
Made patient and family member aware that Dr Thomasene Lot has signed up for patient and should be in soon to see patient.

## 2016-05-26 DIAGNOSIS — F319 Bipolar disorder, unspecified: Secondary | ICD-10-CM | POA: Insufficient documentation

## 2016-05-26 DIAGNOSIS — Z7689 Persons encountering health services in other specified circumstances: Secondary | ICD-10-CM | POA: Insufficient documentation

## 2016-05-26 DIAGNOSIS — N390 Urinary tract infection, site not specified: Secondary | ICD-10-CM | POA: Insufficient documentation

## 2016-05-26 DIAGNOSIS — F1911 Other psychoactive substance abuse, in remission: Secondary | ICD-10-CM | POA: Insufficient documentation

## 2016-05-26 DIAGNOSIS — R7303 Prediabetes: Secondary | ICD-10-CM | POA: Insufficient documentation

## 2016-08-23 ENCOUNTER — Other Ambulatory Visit: Payer: Self-pay | Admitting: Physician Assistant

## 2016-08-23 DIAGNOSIS — N200 Calculus of kidney: Secondary | ICD-10-CM

## 2016-08-23 DIAGNOSIS — R109 Unspecified abdominal pain: Secondary | ICD-10-CM

## 2016-08-23 DIAGNOSIS — M5440 Lumbago with sciatica, unspecified side: Secondary | ICD-10-CM

## 2016-08-29 ENCOUNTER — Encounter (HOSPITAL_COMMUNITY): Payer: Self-pay

## 2016-08-29 ENCOUNTER — Ambulatory Visit (HOSPITAL_COMMUNITY)
Admission: RE | Admit: 2016-08-29 | Discharge: 2016-08-29 | Disposition: A | Discharge status: Home / Self Care | Payer: Medicare Other | Source: Ambulatory Visit | Attending: Physician Assistant | Admitting: Physician Assistant

## 2016-08-29 DIAGNOSIS — R109 Unspecified abdominal pain: Secondary | ICD-10-CM | POA: Diagnosis present

## 2016-08-29 DIAGNOSIS — N200 Calculus of kidney: Secondary | ICD-10-CM | POA: Diagnosis not present

## 2016-08-29 DIAGNOSIS — M545 Low back pain: Secondary | ICD-10-CM | POA: Diagnosis present

## 2016-08-29 DIAGNOSIS — M5440 Lumbago with sciatica, unspecified side: Secondary | ICD-10-CM

## 2016-11-01 ENCOUNTER — Emergency Department (HOSPITAL_COMMUNITY)
Admission: EM | Admit: 2016-11-01 | Discharge: 2016-11-01 | Disposition: A | Discharge status: Home / Self Care | Payer: Medicare Other | Attending: Emergency Medicine | Admitting: Emergency Medicine

## 2016-11-01 ENCOUNTER — Encounter (HOSPITAL_COMMUNITY): Payer: Self-pay | Admitting: Emergency Medicine

## 2016-11-01 DIAGNOSIS — Z79899 Other long term (current) drug therapy: Secondary | ICD-10-CM | POA: Diagnosis not present

## 2016-11-01 DIAGNOSIS — J449 Chronic obstructive pulmonary disease, unspecified: Secondary | ICD-10-CM | POA: Diagnosis not present

## 2016-11-01 DIAGNOSIS — F17210 Nicotine dependence, cigarettes, uncomplicated: Secondary | ICD-10-CM | POA: Diagnosis not present

## 2016-11-01 DIAGNOSIS — T40691A Poisoning by other narcotics, accidental (unintentional), initial encounter: Secondary | ICD-10-CM | POA: Diagnosis not present

## 2016-11-01 DIAGNOSIS — E119 Type 2 diabetes mellitus without complications: Secondary | ICD-10-CM | POA: Diagnosis not present

## 2016-11-01 DIAGNOSIS — T40601A Poisoning by unspecified narcotics, accidental (unintentional), initial encounter: Secondary | ICD-10-CM

## 2016-11-01 DIAGNOSIS — I10 Essential (primary) hypertension: Secondary | ICD-10-CM | POA: Diagnosis not present

## 2016-11-01 DIAGNOSIS — T40694A Poisoning by other narcotics, undetermined, initial encounter: Secondary | ICD-10-CM | POA: Diagnosis present

## 2016-11-01 LAB — I-STAT CHEM 8, ED
BUN: 21 mg/dL — ABNORMAL HIGH (ref 6–20)
CALCIUM ION: 1.13 mmol/L — AB (ref 1.15–1.40)
CREATININE: 1.1 mg/dL — AB (ref 0.44–1.00)
Chloride: 107 mmol/L (ref 101–111)
GLUCOSE: 219 mg/dL — AB (ref 65–99)
HCT: 35 % — ABNORMAL LOW (ref 36.0–46.0)
HEMOGLOBIN: 11.9 g/dL — AB (ref 12.0–15.0)
POTASSIUM: 4.7 mmol/L (ref 3.5–5.1)
Sodium: 140 mmol/L (ref 135–145)
TCO2: 24 mmol/L (ref 22–32)

## 2016-11-01 LAB — RAPID URINE DRUG SCREEN, HOSP PERFORMED
AMPHETAMINES: POSITIVE — AB
BARBITURATES: NOT DETECTED
BENZODIAZEPINES: POSITIVE — AB
COCAINE: NOT DETECTED
OPIATES: NOT DETECTED
TETRAHYDROCANNABINOL: NOT DETECTED

## 2016-11-01 LAB — CBC WITH DIFFERENTIAL/PLATELET
BASOS PCT: 0 %
Basophils Absolute: 0 10*3/uL (ref 0.0–0.1)
EOS ABS: 0 10*3/uL (ref 0.0–0.7)
Eosinophils Relative: 0 %
HCT: 34.8 % — ABNORMAL LOW (ref 36.0–46.0)
HEMOGLOBIN: 12 g/dL (ref 12.0–15.0)
LYMPHS ABS: 0.8 10*3/uL (ref 0.7–4.0)
Lymphocytes Relative: 4 %
MCH: 33.6 pg (ref 26.0–34.0)
MCHC: 34.5 g/dL (ref 30.0–36.0)
MCV: 97.5 fL (ref 78.0–100.0)
MONO ABS: 1.3 10*3/uL — AB (ref 0.1–1.0)
MONOS PCT: 6 %
NEUTROS PCT: 90 %
Neutro Abs: 17.6 10*3/uL — ABNORMAL HIGH (ref 1.7–7.7)
Platelets: 231 10*3/uL (ref 150–400)
RBC: 3.57 MIL/uL — ABNORMAL LOW (ref 3.87–5.11)
RDW: 12.9 % (ref 11.5–15.5)
WBC: 19.7 10*3/uL — ABNORMAL HIGH (ref 4.0–10.5)

## 2016-11-01 LAB — BASIC METABOLIC PANEL
Anion gap: 6 (ref 5–15)
BUN: 15 mg/dL (ref 6–20)
CHLORIDE: 110 mmol/L (ref 101–111)
CO2: 24 mmol/L (ref 22–32)
Calcium: 8.1 mg/dL — ABNORMAL LOW (ref 8.9–10.3)
Creatinine, Ser: 1.14 mg/dL — ABNORMAL HIGH (ref 0.44–1.00)
GFR calc non Af Amer: 58 mL/min — ABNORMAL LOW (ref >60)
Glucose, Bld: 78 mg/dL (ref 65–99)
POTASSIUM: 4.2 mmol/L (ref 3.5–5.1)
SODIUM: 140 mmol/L (ref 135–145)

## 2016-11-01 LAB — I-STAT BETA HCG BLOOD, ED (MC, WL, AP ONLY)

## 2016-11-01 MED ORDER — SODIUM CHLORIDE 0.9 % IV BOLUS (SEPSIS)
500.0000 mL | Freq: Once | INTRAVENOUS | Status: AC
  Administered 2016-11-01: 500 mL via INTRAVENOUS

## 2016-11-01 NOTE — ED Notes (Signed)
Bed: RESA Expected date:  Expected time:  Means of arrival:  Comments: EMS 41 yo female heroin overdose/narcan-BP 75/54

## 2016-11-01 NOTE — ED Triage Notes (Signed)
Pt coming from home, found unresponsive by family. 4mg  Narcan intranasal, 4mg  Zofran, and 400cc normal saline given by EMS. Pt alert but still has some confusion regarding tonight's events.  CBG 342 - not diabetic Systolic BP in low 69'G SP32 O2 100% 85/41 Temp 93.0

## 2016-11-01 NOTE — ED Provider Notes (Signed)
[place] DEPT Provider Note   CSN: [id number] Arrival date & time: 11/01/16  2050     History   Chief Complaint Chief Complaint  Patient presents with  . Drug Overdose    HPI Judy Bailey is a 41 y.o. female.  Is here for suspected narcotic overdose.  EMS was called to her home for unresponsive state, treated with Narcan by EMS with recovery of mentation.  Patient admits to using heroin today, the first time, recently.  She has previously been treated with Suboxone, but is not currently taking prescribed narcotics.  She denies headache, chest pain or dizziness.  She complains of feeling cold.  There are no other known modifying factors.   HPI  Past Medical History:  Diagnosis Date  . Absence of menstruation   . Agoraphobia with panic disorder   . Anxiety   . Attention deficit disorder without mention of hyperactivity   . Backache, unspecified   . Bipolar disorder ([place])   . Body mass index 30.0-30.9, adult   . Borderline personality disorder ([place])   . Depressive disorder, not elsewhere classified   . Diabetes mellitus without complication ([place])   . Disturbance of skin sensation   . Dyspepsia and other specified disorders of function of stomach   . Insomnia, unspecified   . Leukorrhea, not specified as infective   . Night terrors   . Other and unspecified hyperlipidemia   . Other nonspecific finding on examination of urine   . Other symptoms involving digestive system(787.99)   . Ovarian cancer ([place])   . Palpitations   . PTSD (post-traumatic stress disorder)   . 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 Restless legs syndrome (RLS)   . Tobacco use disorder   . Trichomonal vulvovaginitis   . Unspecified essential hypertension   . Unspecified sleep apnea     Patient Active Problem List   Diagnosis Date Noted  . COPD (chronic obstructive pulmonary disease) ([place]) 10/27/2015  . Depression 10/27/2015  . 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 Exertional dyspnea  05/18/2015  . Tobacco user 05/18/2015  . Hypoxemia 05/18/2015  . BV (bacterial vaginosis) 04/24/2013  . Mild dysplasia of cervix 04/24/2013  . Furuncle of buttock 04/09/2013  . Pain 04/09/2013  . Papanicolaou smear of cervix with low grade squamous intraepithelial lesion (LGSIL) 03/10/2013  . Essential (primary) hypertension 02/18/2013  . Postural tremor 06/05/2012  . 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 Hyperlipidemia 04/26/2012  . Obstructive sleep apnea 04/26/2012  . H/O knee surgery 04/26/2012  . H/O external ear surgery 04/26/2012    Past Surgical History:  Procedure Laterality Date  . KNEE CARTILAGE SURGERY     right  . tonsillectomy    . tympanic membrane  2009    OB History    Gravida Para Term Preterm AB Living   1 1 1     1    SAB TAB Ectopic Multiple Live Births           1       Home Medications    Prior to Admission medications   Medication Sig Start Date End Date Taking? Authorizing Provider  albuterol (PROVENTIL HFA;VENTOLIN HFA) 108 (90 Base) MCG/ACT inhaler Inhale 2 puffs into the lungs every 6 (six) hours as needed for wheezing or shortness of breath. 10/27/15   de Dios, [place] A, MD  ALPRAZolam Duanne Moron) 1 MG tablet Take 1 mg by mouth 3 (three) times daily. 09/22/16   [provider]  alprazolam Duanne Moron) 2 MG tablet Take  2 mg by mouth 3 (three) times daily as needed for sleep.     [provider]  amphetamine-dextroamphetamine (ADDERALL XR) 30 MG 24 hr capsule Take 30 mg by mouth daily.  10/17/13   [provider]  amphetamine-dextroamphetamine (ADDERALL) 30 MG tablet Take 30 mg by mouth daily.     [provider]  busPIRone (BUSPAR) 10 MG tablet Take 10 mg by mouth 2 (two) times daily. 09/06/16   [provider]  cyanocobalamin (,VITAMIN B-12,) 1000 MCG/ML injection Inject 1,000 mcg into the muscle every 30 (thirty) days.  02/09/15   [provider]  desvenlafaxine (PRISTIQ) 100 MG 24 hr tablet Take 1 tablet (100 mg  total) by mouth daily. 09/03/12   Arfeen, Arlyce Harman, MD  LATUDA 80 MG TABS tablet Take 80 mg by mouth daily. 10/03/16   [provider]  LORazepam (ATIVAN) 0.5 MG tablet Take 0.5 mg by mouth daily as needed for anxiety. 10/04/16   [provider]  MedroxyPROGESTERone Acetate 150 MG/ML SUSY Inject 150 mg into the skin every 3 (three) months. 10/12/16   [provider]  meloxicam (MOBIC) 15 MG tablet Take 15 mg by mouth daily. 10/23/16   [provider]  prazosin (MINIPRESS) 5 MG capsule Take 5 mg by mouth at bedtime. 10/19/16   [provider]  SUBOXONE 8-2 MG FILM Take 1 Film by mouth 2 (two) times daily.  04/26/16   [provider]  Tiotropium Bromide Monohydrate (SPIRIVA RESPIMAT) 2.5 MCG/ACT AERS Inhale 2 puffs into the lungs daily. Patient not taking: Reported on 05/16/2016 02/04/16   de Dios, Leon, MD  zolpidem (AMBIEN) 10 MG tablet Take 10 mg by mouth at bedtime as needed for sleep.  09/22/16   [provider]    Family History Family History  Problem Relation Age of Onset  . Hypertension Father   . Diabetes Father   . Heart disease Father   . Depression Father   . Colon polyps Father   . Other Father        mad cow disease  . Colon polyps Mother   . Kidney disease Mother   . Irritable bowel syndrome Paternal Aunt   . Bipolar disorder Paternal Aunt   . Prostate cancer Maternal Grandfather   . Liver disease Maternal Grandfather   . Breast cancer Paternal Grandmother   . Diabetes Paternal Grandmother   . Bipolar disorder Paternal Grandmother   . Alzheimer's disease Paternal Grandfather   . Parkinson's disease Paternal Grandfather   . Hypertension Brother   . Bipolar disorder Sister   . Colon polyps Brother   . Clotting disorder Maternal Grandmother   . Irritable bowel syndrome Sister   . Bipolar disorder Cousin     Social History Social History  Substance Use Topics  . Smoking status: Current Every Day Smoker     Packs/day: 1.00    Years: 22.00    Types: Cigarettes  . Smokeless tobacco: Never Used  . Alcohol use No     Allergies   Patient has no known allergies.   Review of Systems Review of Systems  All other systems reviewed and are negative.    Physical Exam Updated Vital Signs BP (!) 95/57   Pulse 78   Temp (!) 96.1 F (35.6 C) (Rectal)   Resp 13   SpO2 100%   Physical Exam  Constitutional: She is oriented to person, place, and time. She appears well-developed and well-nourished. No distress.  HENT:  Head: Normocephalic and atraumatic.  Eyes: Pupils are equal, round, and reactive to light. Conjunctivae and EOM are normal.  Neck: Normal range of motion and phonation normal. Neck supple.  Cardiovascular: Normal rate and regular rhythm.   Pulmonary/Chest: Effort normal and breath sounds normal. She exhibits no tenderness.  Abdominal: Soft. She exhibits no distension. There is no tenderness. There is no guarding.  Musculoskeletal: Normal range of motion.  Moves all extremities equally  Neurological: She is alert and oriented to person, place, and time. She exhibits normal muscle tone.  No dysarthria, aphasia or nystagmus.  Skin: Skin is warm and dry.  Psychiatric: She has a normal mood and affect. Her behavior is normal. Judgment and thought content normal.  Nursing note and vitals reviewed.    ED Treatments / Results  Labs (all labs ordered are listed, but only abnormal results are displayed) Labs Reviewed  RAPID URINE DRUG SCREEN, HOSP PERFORMED - Abnormal; Notable for the following:       Result Value   Benzodiazepines POSITIVE (*)    Amphetamines POSITIVE (*)    All other components within normal limits  CBC WITH DIFFERENTIAL/PLATELET - Abnormal; Notable for the following:    WBC 19.7 (*)    RBC 3.57 (*)    HCT 34.8 (*)    Neutro Abs 17.6 (*)    Monocytes Absolute 1.3 (*)    All other components within normal limits  BASIC METABOLIC PANEL - Abnormal; Notable  for the following:    Creatinine, Ser 1.14 (*)    Calcium 8.1 (*)    GFR calc non Af Amer 58 (*)    All other components within normal limits  I-STAT CHEM 8, ED - Abnormal; Notable for the following:    BUN 21 (*)    Creatinine, Ser 1.10 (*)    Glucose, Bld 219 (*)    Calcium, Ion 1.13 (*)    Hemoglobin 11.9 (*)    HCT 35.0 (*)    All other components within normal limits  CBC WITH DIFFERENTIAL/PLATELET  I-STAT BETA HCG BLOOD, ED ([facility], [facility], [facility] ONLY)    EKG  EKG Interpretation  Date/Time:  Wednesday November 01 2016 21:17:45 EDT Ventricular Rate:  87 PR Interval:    QRS Duration: 92 QT Interval:  371 QTC Calculation: 447 R Axis:   75 Text Interpretation:  Sinus rhythm Since last tracing rate faster Confirmed by Daleen Bo [phone] on 11/01/2016 10:11:24 PM       Radiology No results found.  Procedures Procedures (including critical care time)  Medications Ordered in ED Medications  sodium chloride 0.9 % bolus 500 mL (0 mLs Intravenous Stopped 11/01/16 2210)     Initial Impression / Assessment and Plan / ED Course  I have reviewed the triage vital signs and the nursing notes.  Pertinent labs & imaging results that were available during my care of the patient were reviewed by me and considered in my medical decision making (see chart for details).  Clinical Course as of Nov 01 2299  Wed Nov 01, 2016  2111 The patient is hypothermic, warming blanket initiated  [EW]    Clinical Course User Index [EW] Daleen Bo, MD     Patient Vitals for the past 24 hrs:  BP Temp Temp src Pulse Resp SpO2  11/01/16 2230 (!) 95/57 - - 78 13 100 %  11/01/16 2215 (!) 94/57 - - 83 10 97 %  11/01/16 2208 (!) 91/57 - - 82 12 100 %  11/01/16  2106 - (!) 96.1 F (35.6 C) Rectal - - -  11/01/16 2103 114/60 (!) 97.5 F (36.4 C) Oral 89 - 97 %  11/01/16 2100 114/60 - - 91 - 97 %    11:02 PM Reevaluation with update and discussion. After initial assessment and treatment, an  updated evaluation reveals she states that she is, comfortable at this time.  She states that she has Narcan at home to use as needed.  She expresses no other concerns or problems.  All questions answered. Lycia Sachdeva L      Final Clinical Impressions(s) / ED Diagnoses   Final diagnoses:  Narcotic overdose, accidental or unintentional, initial encounter ([place])   Heroin use with inadvertent overdose.  Patient has recovered without additional respiratory compromise after single dose of Narcan given in the field.  No evidence for acute infectious or metabolic processes.  Nursing Notes Reviewed/ Care Coordinated Applicable Imaging Reviewed Interpretation of Laboratory Data incorporated into ED treatment  The patient appears reasonably screened and/or stabilized for discharge and I doubt any other medical condition or other [facility] requiring further screening, evaluation, or treatment in the ED at this time prior to discharge.  Plan: Home Medications-OTC, as needed; Home Treatments-rest, fluids; return here if the recommended treatment, does not improve the symptoms; Recommended follow up-PCP, as needed     New Prescriptions New Prescriptions   No medications on file     Daleen Bo, MD 11/01/16 2304

## 2016-11-01 NOTE — Discharge Instructions (Signed)
Avoid using heroin or other narcotic medications, which are not prescribed for you.  Return here, if needed, for problems.

## 2016-11-03 ENCOUNTER — Emergency Department (HOSPITAL_COMMUNITY)
Admission: EM | Admit: 2016-11-03 | Discharge: 2016-11-03 | Disposition: A | Discharge status: Home / Self Care | Payer: Medicare Other | Attending: Emergency Medicine | Admitting: Emergency Medicine

## 2016-11-03 DIAGNOSIS — R404 Transient alteration of awareness: Secondary | ICD-10-CM

## 2016-11-03 DIAGNOSIS — Z8543 Personal history of malignant neoplasm of ovary: Secondary | ICD-10-CM | POA: Diagnosis not present

## 2016-11-03 DIAGNOSIS — R4182 Altered mental status, unspecified: Secondary | ICD-10-CM | POA: Diagnosis present

## 2016-11-03 DIAGNOSIS — Z79899 Other long term (current) drug therapy: Secondary | ICD-10-CM | POA: Insufficient documentation

## 2016-11-03 DIAGNOSIS — E119 Type 2 diabetes mellitus without complications: Secondary | ICD-10-CM | POA: Diagnosis not present

## 2016-11-03 DIAGNOSIS — I10 Essential (primary) hypertension: Secondary | ICD-10-CM | POA: Diagnosis not present

## 2016-11-03 DIAGNOSIS — J449 Chronic obstructive pulmonary disease, unspecified: Secondary | ICD-10-CM | POA: Diagnosis not present

## 2016-11-03 DIAGNOSIS — F17210 Nicotine dependence, cigarettes, uncomplicated: Secondary | ICD-10-CM | POA: Insufficient documentation

## 2016-11-03 NOTE — ED Notes (Signed)
PEER SUPPORT AT BEDSIDE 
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
## 2016-11-03 NOTE — Discharge Instructions (Signed)
No serious problems were found today  Continue to avoid using heroin
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
## 2016-11-03 NOTE — ED Provider Notes (Signed)
[facility] DEPT Provider Note   CSN: [id number] Arrival date & time: 11/03/16  1246     History   Chief Complaint Chief Complaint  Patient presents with  . Drug Overdose    HPI Judy Bailey is a 41 y.o. female.  She was transferred here by EMS after she was treated with Narcan, when she was found unresponsive.  Patient states she was sitting in a car and fell asleep because she has not been able to sleep well since yesterday.  She is in the emergency department yesterday and treated by me for heroin overdose requiring Narcan treatment.  She denies using heroin, since yesterday.  She denies headache, chest pain, weakness or dizziness.  There are no other known modifying factors.  HPI  Past Medical History:  Diagnosis Date  . Absence of menstruation   . Agoraphobia with panic disorder   . Anxiety   . Attention deficit disorder without mention of hyperactivity   . Backache, unspecified   . Bipolar disorder ([place])   . Body mass index 30.0-30.9, adult   . Borderline personality disorder ([place])   . Depressive disorder, not elsewhere classified   . Diabetes mellitus without complication ([place])   . Disturbance of skin sensation   . Dyspepsia and other specified disorders of function of stomach   . Insomnia, unspecified   . Leukorrhea, not specified as infective   . Night terrors   . Other and unspecified hyperlipidemia   . Other nonspecific finding on examination of urine   . Other symptoms involving digestive system(787.99)   . Ovarian cancer ([place])   . Palpitations   . PTSD (post-traumatic stress disorder)   . 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 Restless legs syndrome (RLS)   . Tobacco use disorder   . Trichomonal vulvovaginitis   . Unspecified essential hypertension   . Unspecified sleep apnea     Patient Active Problem List   Diagnosis Date Noted  . COPD (chronic obstructive pulmonary disease) ([place]) 10/27/2015  . Depression 10/27/2015  . Radial neuropathy,  right 10/26/2015  . Exertional dyspnea 05/18/2015  . Tobacco user 05/18/2015  . Hypoxemia 05/18/2015  . BV (bacterial vaginosis) 04/24/2013  . Mild dysplasia of cervix 04/24/2013  . Furuncle of buttock 04/09/2013  . Pain 04/09/2013  . Papanicolaou smear of cervix with low grade squamous intraepithelial lesion (LGSIL) 03/10/2013  . Essential (primary) hypertension 02/18/2013  . Postural tremor 06/05/2012  . 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 Hyperlipidemia 04/26/2012  . Obstructive sleep apnea 04/26/2012  . H/O knee surgery 04/26/2012  . H/O external ear surgery 04/26/2012    Past Surgical History:  Procedure Laterality Date  . KNEE CARTILAGE SURGERY     right  . tonsillectomy    . tympanic membrane  2009    OB History    Gravida Para Term Preterm AB Living   1 1 1     1    SAB TAB Ectopic Multiple Live Births           1       Home Medications    Prior to Admission medications   Medication Sig Start Date End Date Taking? Authorizing Provider  albuterol (PROVENTIL HFA;VENTOLIN HFA) 108 (90 Base) MCG/ACT inhaler Inhale 2 puffs into the lungs every 6 (six) hours as needed for wheezing or shortness of breath. 10/27/15  Yes de [place], D[place] A, MD  alprazolam Duanne Moron) 2 MG tablet Take 2 mg by mouth 3 (three) times daily as needed for sleep.  Yes [provider]  amphetamine-dextroamphetamine (ADDERALL) 30 MG tablet Take 30 mg by mouth daily.    Yes [provider]  Artificial Tear Ointment (EYE LUBRICANT) OINT Place 1 inch into both eyes at bedtime.   Yes [provider]  Cholecalciferol (VITAMIN D3) 2000 units CHEW Chew 2,000 mg by mouth daily.   Yes [provider]  Cyanocobalamin (VITAMIN B 12 PO) Take 1 tablet by mouth daily. 1500 MGS   Yes [provider]  desvenlafaxine (PRISTIQ) 100 MG 24 hr tablet Take 1 tablet (100 mg total) by mouth daily. 09/03/12  Yes Arfeen, Arlyce Harman, MD  meloxicam (MOBIC) 15 MG tablet Take 15 mg by mouth daily.  10/23/16  Yes [provider]  SUBOXONE 8-2 MG FILM Take 1 Film by mouth 2 (two) times daily.  04/26/16  Yes [provider]  Tetrahydrozoline HCl (EYE DROPS OP) Place 1-2 drops into both eyes 3 (three) times daily.   Yes [provider]  Tiotropium Bromide Monohydrate (SPIRIVA RESPIMAT) 2.5 MCG/ACT AERS Inhale 2 puffs into the lungs daily. 02/04/16  Yes de [place], [place], MD  zolpidem (AMBIEN) 10 MG tablet Take 10 mg by mouth at bedtime as needed for sleep.  09/22/16  Yes [provider]    Family History Family History  Problem Relation Age of Onset  . Hypertension Father   . Diabetes Father   . Heart disease Father   . Depression Father   . Colon polyps Father   . Other Father        mad cow disease  . Colon polyps Mother   . Kidney disease Mother   . Irritable bowel syndrome Paternal Aunt   . Bipolar disorder Paternal Aunt   . Prostate cancer Maternal Grandfather   . Liver disease Maternal Grandfather   . Breast cancer Paternal Grandmother   . Diabetes Paternal Grandmother   . Bipolar disorder Paternal Grandmother   . Alzheimer's disease Paternal Grandfather   . Parkinson's disease Paternal Grandfather   . Hypertension Brother   . Bipolar disorder Sister   . Colon polyps Brother   . Clotting disorder Maternal Grandmother   . Irritable bowel syndrome Sister   . Bipolar disorder Cousin     Social History Social History  Substance Use Topics  . Smoking status: Current Every Day Smoker    Packs/day: 1.00    Years: 22.00    Types: Cigarettes  . Smokeless tobacco: Never Used  . Alcohol use No     Allergies   Patient has no known allergies.   Review of Systems Review of Systems  All other systems reviewed and are negative.    Physical Exam Updated Vital Signs BP 97/63 (BP Location: Right Arm)   Pulse 75   Temp 97.7 F (36.5 C) (Oral)   Resp 18   SpO2 97%   Physical Exam  Constitutional: She is oriented to person,  place, and time. She appears well-developed and well-nourished. She appears distressed.  HENT:  Head: Normocephalic and atraumatic.  Eyes: Pupils are equal, round, and reactive to light. Conjunctivae and EOM are normal.  Neck: Normal range of motion and phonation normal. Neck supple.  Cardiovascular: Normal rate.   Pulmonary/Chest: Effort normal.  Musculoskeletal: Normal range of motion.  Neurological: She is alert and oriented to person, place, and time. She exhibits normal muscle tone.  No dysarthria, aphasia or nystagmus.  Skin: Skin is warm and dry.  Psychiatric: She has a normal mood and  affect. Her behavior is normal. Judgment and thought content normal.  Nursing note and vitals reviewed.    ED Treatments / Results  Labs (all labs ordered are listed, but only abnormal results are displayed) Labs Reviewed - No data to display  EKG  EKG Interpretation None       Radiology No results found.  Procedures Procedures (including critical care time)  Medications Ordered in ED Medications - No data to display   Initial Impression / Assessment and Plan / ED Course  I have reviewed the triage vital signs and the nursing notes.  Pertinent labs & imaging results that were available during my care of the patient were reviewed by me and considered in my medical decision making (see chart for details).      Patient Vitals for the past 24 hrs:  BP Temp Temp src Pulse Resp SpO2  11/03/16 1503 100/66 98 F (36.7 C) Oral 76 16 100 %  11/03/16 1303 97/63 97.7 F (36.5 C) Oral 75 18 97 %  11/03/16 1255 - - - - - 99 %    At discharge- reevaluation with update and discussion. After initial assessment and treatment, an updated evaluation reveals patient comfortable has no further complaints.  Findings discussed with the patient and all questions answered. Nakeysha Pasqual L      Final Clinical Impressions(s) / ED Diagnoses   Final diagnoses:  Transient alteration of awareness      Nonspecific altered mental status.  Patient with history of narcotics abuse, but at this time does not show any signs of respiratory depression or altered mental status.  No indication for further evaluation, or treatment.  Nursing Notes Reviewed/ Care Coordinated Applicable Imaging Reviewed Interpretation of Laboratory Data incorporated into ED treatment  The patient appears reasonably screened and/or stabilized for discharge and I doubt any other medical condition or other [facility] requiring further screening, evaluation, or treatment in the ED at this time prior to discharge.  Plan: Home Medications-continue usual medications; Home Treatments-avoid narcotics; return here if the recommended treatment, does not improve the symptoms; Recommended follow up-PCP prn   New Prescriptions New Prescriptions   No medications on file     Daleen Bo, MD 11/03/16 1754

## 2016-11-03 NOTE — ED Notes (Signed)
PT REQUESTING TO LEAVE. PT GIVEN A PHONE. EDP WENTZ MADE AWARE. EN ROUTE TO SEE PT. BELONGINGS GIVEN BACK TO PT

## 2016-11-03 NOTE — ED Notes (Signed)
PT STATES SHE HERE AT [facility] LAST NIGHT FOR HEROIN OVERDOSE. STATES HAS BEEN CLEAN FOR 6 MONTHS. SHE LEFT AT 2200 LAST NIGHT. PT STATES SHE SLEEPS WITH CPAP MACHINE AND POWER WAS OUT. PT STATES SHE TOOK REMAINDER OF HEROIN AND DISPOSED OF IT.  PT STATES SHE TOOK 2 MG XANAX PO AT 0800 AND 12 NOON TODAY.  (HER RX STATES XANAX 2 MG TID). PT PRESENT ALERT AND ACTIVE WITH CARE. PUPILS PINPOINT AND REACTIVE. PT DENIES ANY OTHER INGESTIONS.

## 2016-11-03 NOTE — ED Notes (Signed)
PT WITHOUT COMPLAINTS. RIDE PRESENT.

## 2016-11-30 IMAGING — DX DG CHEST 2V
2 series · 2 of 2 positions shown · non-contrast
Comparison: 01/11/2015

CLINICAL DATA: Shortness of breath with exertion for 1 year. COPD.
Cough, hypertension, smoker.

EXAM:
CHEST  2 VIEW

[chest pa]
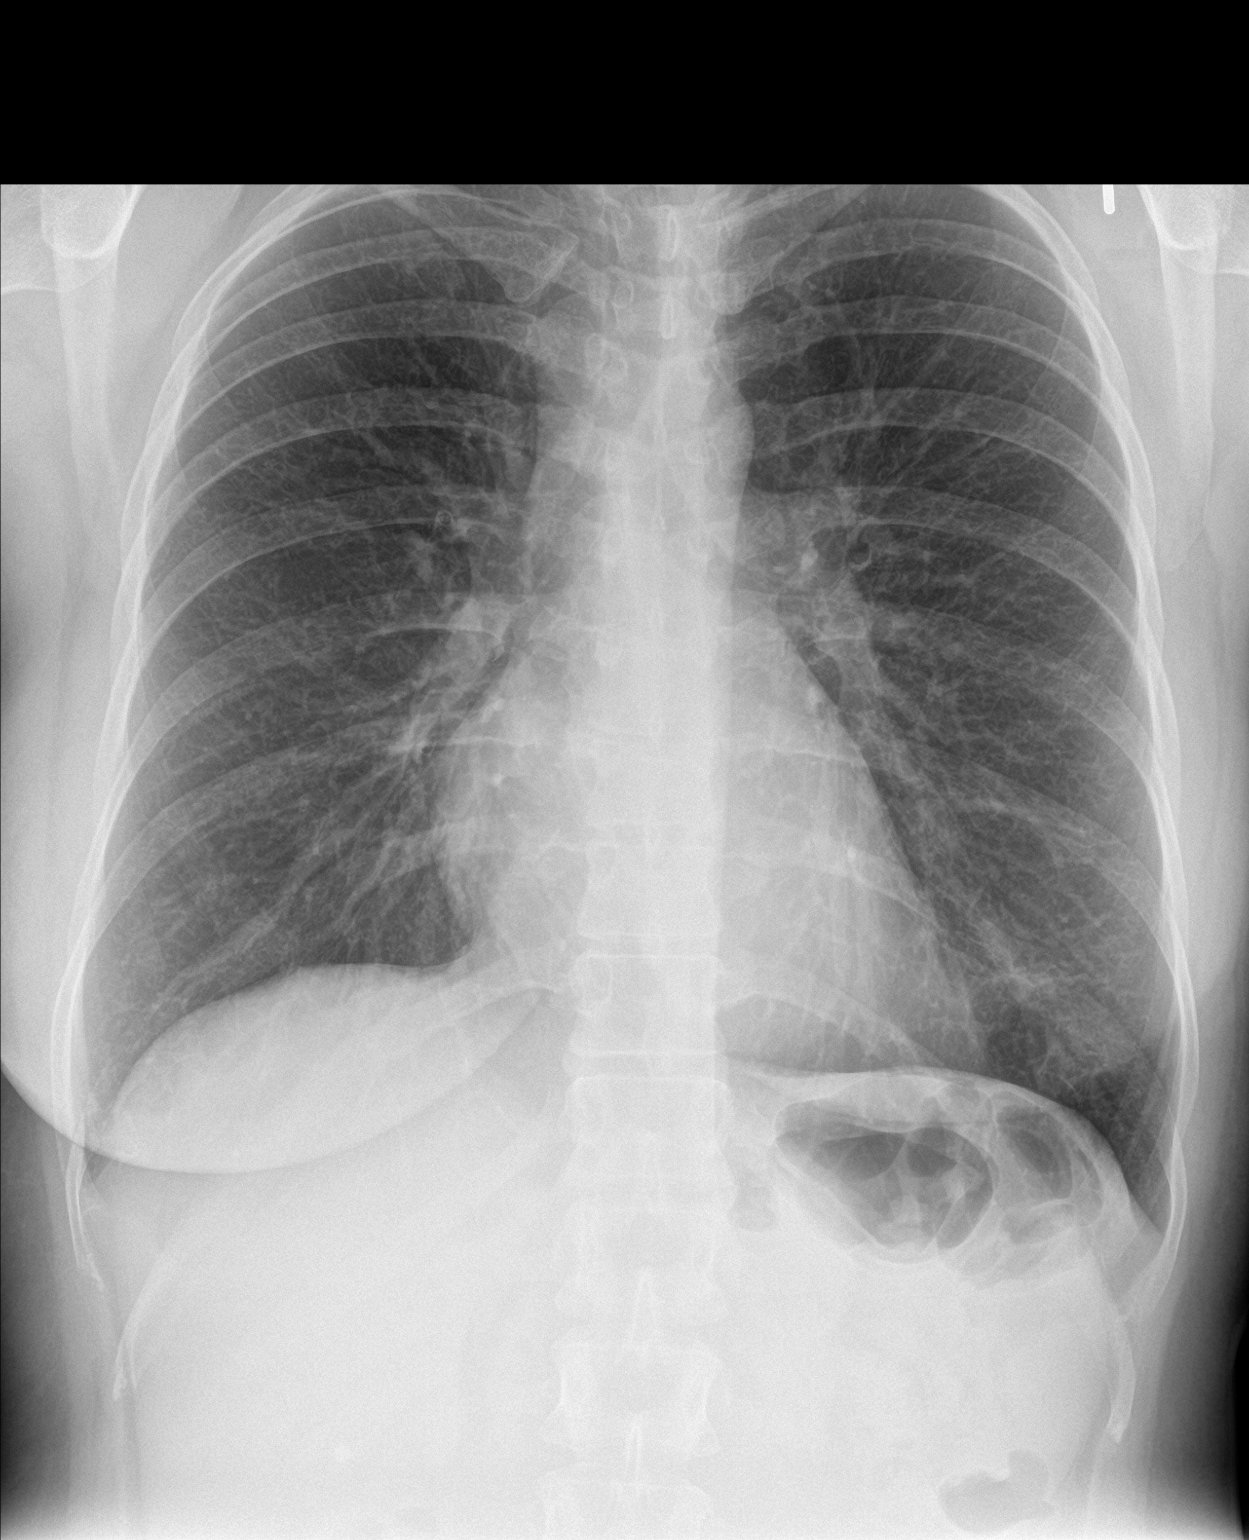

[chest lat]
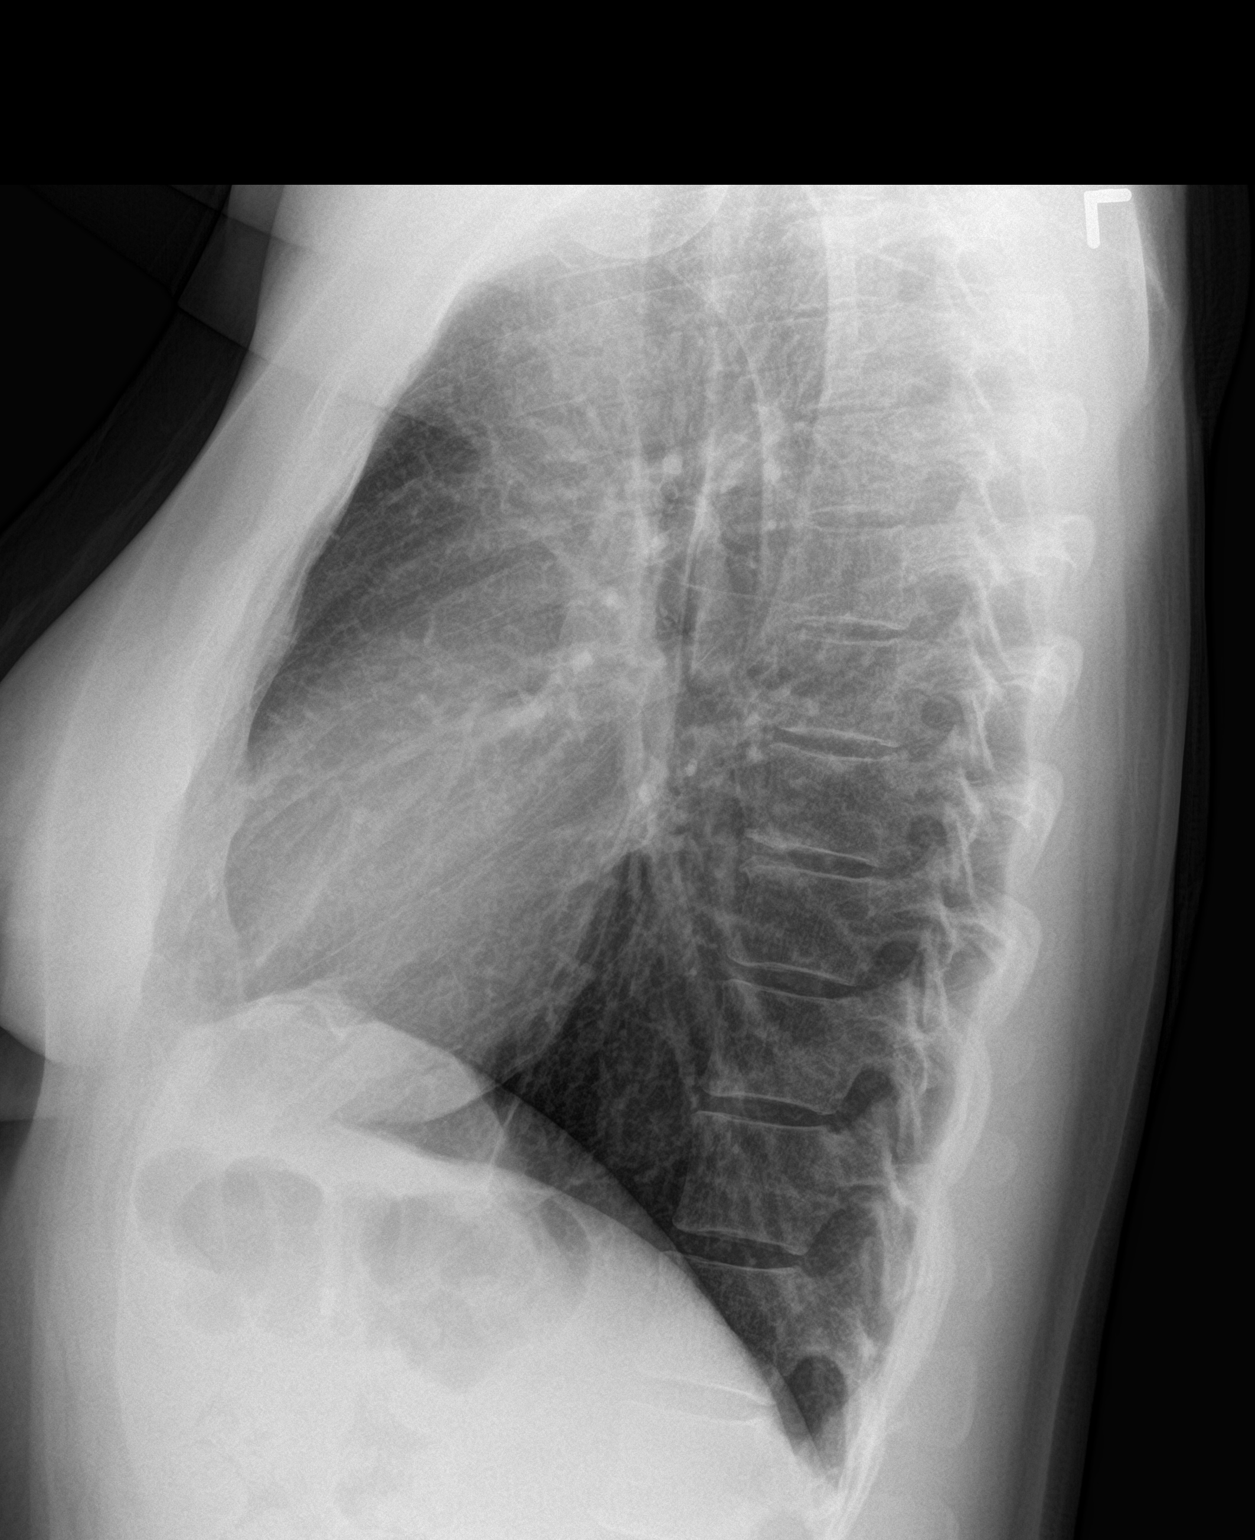

[2 of 2 positions shown; findings below may reference images not displayed]

FINDINGS: Heart and mediastinal contours are within normal limits. No focal
opacities or effusions. No acute bony abnormality.
IMPRESSION: No active cardiopulmonary disease.

## 2016-12-06 ENCOUNTER — Telehealth (HOSPITAL_COMMUNITY): Payer: Self-pay

## 2016-12-13 ENCOUNTER — Telehealth: Payer: Self-pay | Admitting: Neurology

## 2016-12-13 NOTE — Telephone Encounter (Signed)
I will be happy to see the patient, but I do need to examine her before any letter is sent.

## 2016-12-13 NOTE — Telephone Encounter (Signed)
Patient is calling. She is still having problems with her hands. She had therapy which helped with straightening out her hands but her fingers are still shaking uncontrollably. She needs to discuss getting a letter stating she cannot work because of shaking. She is a Copywriter, advertising. She would like message sent to Dr. Jannifer Franklin since he did NCV/EMG  but Dr. Rexene Alberts is her doctor. I advised she may need appointment since it has been over a year since testing.

## 2016-12-18 NOTE — Telephone Encounter (Signed)
Called pt. Scheduled appt for tomorrow at 1130am to see Dr Jannifer Franklin. Advised pt to check in no later than 1115am, bring insurance cards, copay and updated medication list.  She verbalized understanding.

## 2016-12-19 ENCOUNTER — Other Ambulatory Visit: Payer: Self-pay

## 2016-12-19 ENCOUNTER — Ambulatory Visit (INDEPENDENT_AMBULATORY_CARE_PROVIDER_SITE_OTHER): Payer: Medicare Other | Admitting: Neurology

## 2016-12-19 ENCOUNTER — Encounter: Payer: Self-pay | Admitting: Neurology

## 2016-12-19 VITALS — BP 128/82 | HR 87 | Ht 68.0 in | Wt 167.0 lb

## 2016-12-19 DIAGNOSIS — R251 Tremor, unspecified: Secondary | ICD-10-CM

## 2016-12-19 MED ORDER — PROPRANOLOL HCL 20 MG PO TABS: 20.0000 mg | ORAL_TABLET | Freq: Two times a day (BID) | ORAL | 3 refills | Status: DC

## 2016-12-19 NOTE — Patient Instructions (Signed)
   We will start propranolol for the tremor.  Inderal (propranolol) is a blood pressure medication that is commonly used for migraine headaches. This is a type of beta blocker. The most common side effects include low heart rate, dizziness, fatigue, and increased depression. This medication may worsen asthma. If you believe that you are having side effects on this medication, please contact our office.

## 2016-12-19 NOTE — Progress Notes (Signed)
Reason for visit: Tremor  Judy Bailey is a 41 y.o. female  History of present illness:  Judy Bailey is a 41 year old right-handed white female with a history of a right wrist drop that occurred in August 2017.  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  The patient claims that around that time she began developing some fine action tremors involving both upper extremities.  In the past, she has been on lithium. Lithium was listed as a medication when she was evaluated for the radial neuropathy in September 2017.  The patient claims however that she was not on the medication at that time, she took it in the past but was on it only for 3 months or so.  The patient has also been on Depakote in the past, she is no longer on this medication.  The patient claims that she has had blood work evaluation that has included a thyroid profile, it is her impression that this was normal.  The patient has been put out on disability by her psychiatrist since September 2017, the patient works as a Copywriter, advertising.  The patient takes Adderall, she has noted that a reduction in the dosing has helped her tremor.  The patient takes 2 mg of Xanax 3 times daily, this also helps the tremor some.  The tremor usually is worse in the morning, the patient takes short acting Adderall in the morning.  The patient denies any tremor involving the head or neck or legs.  The patient has had some mild balance troubles, she denies any falls.  She denies issues controlling the bowels with the bladder.  She has not reported any difficulty with memory, she does have restless leg syndrome.  She recently was diagnosed with renal calculi.  She is sent to this office for an evaluation.  Past Medical History:  Diagnosis Date  . Absence of menstruation   . Agoraphobia with panic disorder   . Anxiety   . Attention deficit disorder without mention of hyperactivity   . Backache, unspecified   . Bipolar disorder ([place])   . Body  mass index 30.0-30.9, adult   . Borderline personality disorder ([place])   . Depressive disorder, not elsewhere classified   . Diabetes mellitus without complication ([place])   . Disturbance of skin sensation   . Dyspepsia and other specified disorders of function of stomach   . Insomnia, unspecified   . Leukorrhea, not specified as infective   . Night terrors   . Other and unspecified hyperlipidemia   . Other nonspecific finding on examination of urine   . Other symptoms involving digestive system(787.99)   . Ovarian cancer ([place])   . Palpitations   . PTSD (post-traumatic stress disorder)   . 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 Restless legs syndrome (RLS)   . Tobacco use disorder   . Trichomonal vulvovaginitis   . Unspecified essential hypertension   . Unspecified sleep apnea     Past Surgical History:  Procedure Laterality Date  . KNEE CARTILAGE SURGERY     right  . tonsillectomy    . tympanic membrane  2009    Family History  Problem Relation Age of Onset  . Hypertension Father   . Diabetes Father   . Heart disease Father   . Depression Father   . Colon polyps Father   . Other Father        mad cow disease  . Colon polyps Mother   . Kidney  disease Mother   . Irritable bowel syndrome Paternal Aunt   . Bipolar disorder Paternal Aunt   . Prostate cancer Maternal Grandfather   . Liver disease Maternal Grandfather   . Breast cancer Paternal Grandmother   . Diabetes Paternal Grandmother   . Bipolar disorder Paternal Grandmother   . Alzheimer's disease Paternal Grandfather   . Parkinson's disease Paternal Grandfather   . Hypertension Brother   . Bipolar disorder Sister   . Colon polyps Brother   . Clotting disorder Maternal Grandmother   . Irritable bowel syndrome Sister   . Bipolar disorder Cousin     Social history:  reports that she has been smoking cigarettes.  She has a 11.00 pack-year smoking history. she has never used smokeless tobacco. She reports that she  does not drink alcohol or use drugs.  Medications:  Prior to Admission medications   Medication Sig Start Date End Date Taking? Authorizing Provider  alprazolam Duanne Moron) 2 MG tablet Take 2 mg by mouth 3 (three) times daily as needed for sleep.    Yes [provider]  amphetamine-dextroamphetamine (ADDERALL XR) 30 MG 24 hr capsule Take 30 mg by mouth every morning.   Yes [provider]  amphetamine-dextroamphetamine (ADDERALL) 30 MG tablet Take 30 mg by mouth daily.    Yes [provider]  Artificial Tear Ointment (EYE LUBRICANT) OINT Place 1 inch into both eyes at bedtime.   Yes [provider]  Cholecalciferol (VITAMIN D3) 2000 units CHEW Chew 2,000 mg by mouth daily.   Yes [provider]  Cyanocobalamin (VITAMIN B 12 PO) Take 1 tablet by mouth daily. 1500 MGS   Yes [provider]  desvenlafaxine (PRISTIQ) 100 MG 24 hr tablet Take 1 tablet (100 mg total) by mouth daily. 09/03/12  Yes Arfeen, Arlyce Harman, MD  meloxicam (MOBIC) 15 MG tablet Take 15 mg by mouth daily. 10/23/16  Yes [provider]  Tetrahydrozoline HCl (EYE DROPS OP) Place 1-2 drops into both eyes 3 (three) times daily.   Yes [provider]  Tiotropium Bromide Monohydrate (SPIRIVA RESPIMAT) 2.5 MCG/ACT AERS Inhale 2 puffs into the lungs daily. 02/04/16  Yes de [place], [place], MD  zolpidem (AMBIEN) 10 MG tablet Take 20 mg by mouth at bedtime as needed for sleep.  09/22/16  Yes [provider]     No Known Allergies  ROS:  Out of a complete 14 system review of symptoms, the patient complains only of the following symptoms, and all other reviewed systems are negative.  Decreased activity Blurred vision Excessive thirst Restless legs, insomnia, sleep apnea, frequent waking, daytime sleepiness, snoring, sleep talking, sleepwalking, acting out dreams Myoclonic jerks, back pain, muscle cramps, walking difficulty Dizziness, numbness, weakness,  tremors Decreased concentration, depression, anxiety  Blood pressure 128/82, pulse 87, height 5\' 8"  (2.671 m), weight 167 lb (75.8 kg).  Physical Exam  General: The patient is alert and cooperative at the time of the examination.  Eyes: Pupils are equal, round, and reactive to light. Discs are flat bilaterally.  Venous pulsations are seen bilaterally.  Neck: The neck is supple, no carotid bruits are noted.  Respiratory: The respiratory examination is clear.  Cardiovascular: The cardiovascular examination reveals a regular rate and rhythm, no obvious murmurs or rubs are noted.  Skin: Extremities are without significant edema.  Neurologic Exam  Mental status: The patient is alert and oriented x 3 at the time of the examination. The patient has apparent normal recent and remote memory, with  an apparently normal attention span and concentration ability.  Cranial nerves: Facial symmetry is present. There is good sensation of the face to pinprick and soft touch bilaterally. The strength of the facial muscles and the muscles to head turning and shoulder shrug are normal bilaterally. Speech is well enunciated, no aphasia or dysarthria is noted. Extraocular movements are full. Visual fields are full. The tongue is midline, and the patient has symmetric elevation of the soft palate. No obvious hearing deficits are noted.  Motor: The motor testing reveals 5 over 5 strength of all 4 extremities. Good symmetric motor tone is noted throughout.  Sensory: Sensory testing is intact to pinprick, soft touch, vibration sensation, and position sense on all 4 extremities. No evidence of extinction is noted.  Coordination: Cerebellar testing reveals good finger-nose-finger and heel-to-shin bilaterally.  A fine action tremor seen with finger-nose-finger bilaterally.  Minimal tremor is noted in handwriting, drawing a spiral.  Gait and station: Gait is normal. Tandem gait is normal. Romberg is negative. No  drift is seen.  Reflexes: Deep tendon reflexes are symmetric and normal bilaterally. Toes are downgoing bilaterally.   Assessment/Plan:  1.  Enhanced physiologic tremor  The patient appears to have a prominent physiologic tremor.  The patient has been on lithium in the past, this can result in a permanent tremor, but the patient claims that she was on the medication only 3 or 4 months.  The patient will be sent for thyroid hormone levels, a reduction of the Adderall dose may improve the tremor as well.  The patient claims that she does not drink caffeinated products during the day.  The patient will be placed on propranolol 20 mg twice daily.  She will follow-up in 3-4 months.  Jill Alexanders MD 12/19/2016 11:50 AM  [facility] [address] [place],  [address]  Phone [phone] Fax [phone]

## 2016-12-20 LAB — T3, FREE: T3 FREE: 3 pg/mL (ref 2.0–4.4)

## 2016-12-20 LAB — T4, FREE: Free T4: 1.21 ng/dL (ref 0.82–1.77)

## 2017-02-13 ENCOUNTER — Other Ambulatory Visit: Payer: Self-pay

## 2017-02-21 ENCOUNTER — Other Ambulatory Visit: Payer: Self-pay | Admitting: Acute Care

## 2017-02-27 ENCOUNTER — Telehealth: Payer: Self-pay | Admitting: Neurology

## 2017-02-27 MED ORDER — PROPRANOLOL HCL 20 MG PO TABS: 40.0000 mg | ORAL_TABLET | Freq: Two times a day (BID) | ORAL | 3 refills | Status: DC

## 2017-02-27 NOTE — Addendum Note (Signed)
Addended by: Kathrynn Ducking on: 02/27/2017 11:27 AM   Modules accepted: Orders

## 2017-02-27 NOTE — Telephone Encounter (Signed)
I called the patient.  The propranolol dose at 20 mg twice daily is not really helping her tremors much, the patient will double the dose to 40 mg twice daily, she will call if she has any concerns.  I sent in another prescription for the propranolol.

## 2017-02-27 NOTE — Telephone Encounter (Signed)
Patient calling stating propranolol (INDERAL) 20 MG tablet is not working for her tremors. Can another medication be called in? Please call to discuss. Patient uses [facility] on [address].

## 2017-03-30 ENCOUNTER — Other Ambulatory Visit: Payer: Self-pay | Admitting: Neurology

## 2017-04-18 ENCOUNTER — Encounter: Payer: Self-pay | Admitting: Adult Health

## 2017-04-18 ENCOUNTER — Ambulatory Visit (INDEPENDENT_AMBULATORY_CARE_PROVIDER_SITE_OTHER): Payer: Medicare Other | Admitting: Adult Health

## 2017-04-18 VITALS — BP 141/81 | HR 92 | Ht 68.0 in | Wt 158.5 lb

## 2017-04-18 DIAGNOSIS — R251 Tremor, unspecified: Secondary | ICD-10-CM | POA: Diagnosis not present

## 2017-04-18 NOTE — Progress Notes (Signed)
PATIENT: Judy Bailey DOB: 10-19-1975  REASON FOR VISIT: follow up HISTORY FROM: patient  HISTORY OF PRESENT ILLNESS: Today 04/18/17  Judy Bailey is a 42 year old female with a history of right wrist drop and tremors.  She returns today for evaluation.  He still having significant trouble with tremors.  She was placed on propranolol and worked up to 40 mg twice a day and reports that this is not offering her any benefit with her tremor.  Reports that her cardiologist increase propranolol to 120 mg but she is still not noticing benefit in regards to the tremor.  The patient states that the tremor is constant.  She notices it at rest and with activity.  She reports that she is able to write but her handwriting is messy.  Is able to use a fork.  She does have difficulty fastening buttons.  She reports that her Adderall was decreased.  She is now only taking Adderall extended release 30 mg daily.  The immediate release dose was discontinued.  She returns today for evaluation.  HISTORY 12/19/16: Judy Bailey is a 42 year old right-handed white female with a history of a right wrist drop that occurred in August 2017.  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  The patient claims that around that time she began developing some fine action tremors involving both upper extremities.  In the past, she has been on lithium. Lithium was listed as a medication when she was evaluated for the radial neuropathy in September 2017.  The patient claims however that she was not on the medication at that time, she took it in the past but was on it only for 3 months or so.  The patient has also been on Depakote in the past, she is no longer on this medication.  The patient claims that she has had blood work evaluation that has included a thyroid profile, it is her impression that this was normal.  The patient has been put out on disability by her psychiatrist since September 2017, the patient works as a Investment banker, operational.  The patient takes Adderall, she has noted that a reduction in the dosing has helped her tremor.  The patient takes 2 mg of Xanax 3 times daily, this also helps the tremor some.  The tremor usually is worse in the morning, the patient takes short acting Adderall in the morning.  The patient denies any tremor involving the head or neck or legs.  The patient has had some mild balance troubles, she denies any falls.  She denies issues controlling the bowels with the bladder.  She has not reported any difficulty with memory, she does have restless leg syndrome.  She recently was diagnosed with renal calculi.  She is sent to this office for an evaluation   REVIEW OF SYSTEMS: Out of a complete 14 system review of symptoms, the patient complains only of the following symptoms, and all other reviewed systems are negative.  Memory loss, shortness of breath, insomnia, apnea, frequent waking, daytime sleepiness, snoring, sleep talking, walking, acting out dreams, depression, nervous/anxious, back pain  ALLERGIES: No Known Allergies  HOME MEDICATIONS: Outpatient Medications Prior to Visit  Medication Sig Dispense Refill  . alprazolam (XANAX) 2 MG tablet Take 2 mg by mouth 3 (three) times daily as needed for sleep.     Marland Kitchen amphetamine-dextroamphetamine (ADDERALL XR) 30 MG 24 hr capsule Take 30 mg by mouth every morning.    Marland Kitchen amphetamine-dextroamphetamine (ADDERALL) 30 MG  tablet Take 30 mg by mouth daily.     . Cholecalciferol (VITAMIN D3) 2000 units CHEW Chew 2,000 mg by mouth daily.    . Cyanocobalamin (VITAMIN B 12 PO) Take 1 tablet by mouth daily. 1500 MGS    . desvenlafaxine (PRISTIQ) 100 MG 24 hr tablet Take 1 tablet (100 mg total) by mouth daily. 30 tablet 0  . propranolol (INDERAL) 20 MG tablet Take 2 tablets (40 mg total) by mouth 2 (two) times daily. 120 tablet 3  . Tiotropium Bromide Monohydrate (SPIRIVA RESPIMAT) 2.5 MCG/ACT AERS Inhale 2 puffs into the lungs daily. 1 Inhaler 5  .  zolpidem (AMBIEN) 10 MG tablet Take 20 mg by mouth at bedtime as needed for sleep.   0  . Artificial Tear Ointment (EYE LUBRICANT) OINT Place 1 inch into both eyes at bedtime.    . meloxicam (MOBIC) 15 MG tablet Take 15 mg by mouth daily.  0  . Tetrahydrozoline HCl (EYE DROPS OP) Place 1-2 drops into both eyes 3 (three) times daily.     No facility-administered medications prior to visit.     PAST MEDICAL HISTORY: Past Medical History:  Diagnosis Date  . Absence of menstruation   . Agoraphobia with panic disorder   . Anxiety   . Attention deficit disorder without mention of hyperactivity   . Backache, unspecified   . Bipolar disorder ([place])   . Body mass index 30.0-30.9, adult   . Borderline personality disorder ([place])   . Depressive disorder, not elsewhere classified   . Diabetes mellitus without complication ([place])   . Disturbance of skin sensation   . Dyspepsia and other specified disorders of function of stomach   . Insomnia, unspecified   . Leukorrhea, not specified as infective   . Night terrors   . Other and unspecified hyperlipidemia   . Other nonspecific finding on examination of urine   . Other symptoms involving digestive system(787.99)   . Ovarian cancer ([place])   . Palpitations   . PTSD (post-traumatic stress disorder)   . 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 Restless legs syndrome (RLS)   . Tobacco use disorder   . Trichomonal vulvovaginitis   . Unspecified essential hypertension   . Unspecified sleep apnea     PAST SURGICAL HISTORY: Past Surgical History:  Procedure Laterality Date  . KNEE CARTILAGE SURGERY     right  . tonsillectomy    . tympanic membrane  2009    FAMILY HISTORY: Family History  Problem Relation Age of Onset  . Hypertension Father   . Diabetes Father   . Heart disease Father   . Depression Father   . Colon polyps Father   . Other Father        mad cow disease  . Colon polyps Mother   . Kidney disease Mother   . Irritable bowel  syndrome Paternal Aunt   . Bipolar disorder Paternal Aunt   . Prostate cancer Maternal Grandfather   . Liver disease Maternal Grandfather   . Breast cancer Paternal Grandmother   . Diabetes Paternal Grandmother   . Bipolar disorder Paternal Grandmother   . Alzheimer's disease Paternal Grandfather   . Parkinson's disease Paternal Grandfather   . Hypertension Brother   . Bipolar disorder Sister   . Colon polyps Brother   . Clotting disorder Maternal Grandmother   . Irritable bowel syndrome Sister   . Bipolar disorder Cousin     SOCIAL HISTORY: Social History   Socioeconomic History  . Marital  status: Divorced    Spouse name: Not on file  . Number of children: 1  . Years of education: 72  . Highest education level: Not on file  Occupational History  . Occupation: Disability   Social Needs  . Financial resource strain: Not on file  . Food insecurity:    Worry: Not on file    Inability: Not on file  . Transportation needs:    Medical: Not on file    Non-medical: Not on file  Tobacco Use  . Smoking status: Current Every Day Smoker    Packs/day: 0.50    Years: 22.00    Pack years: 11.00    Types: Cigarettes  . Smokeless tobacco: Never Used  Substance and Sexual Activity  . Alcohol use: No    Alcohol/week: 0.0 oz  . Drug use: No  . Sexual activity: Not Currently    Partners: Male    Birth control/protection: Abstinence  Lifestyle  . Physical activity:    Days per week: Not on file    Minutes per session: Not on file  . Stress: Not on file  Relationships  . Social connections:    Talks on phone: Not on file    Gets together: Not on file    Attends religious service: Not on file    Active member of club or organization: Not on file    Attends meetings of clubs or organizations: Not on file    Relationship status: Not on file  . Intimate partner violence:    Fear of current or ex partner: Not on file    Emotionally abused: Not on file    Physically abused: Not  on file    Forced sexual activity: Not on file  Other Topics Concern  . Not on file  Social History Narrative   Pt lives at home with mother and son.   Caffeine Use: 2 sodas weekly   Right handed       PHYSICAL EXAM  Vitals:   04/18/17 1303  BP: (!) 141/81  Pulse: 92  Weight: 158 lb 8 oz (71.9 kg)  Height: 5\' 8"  (1.727 m)   Body mass index is 24.1 kg/m.  Generalized: Well developed, in no acute distress   Neurological examination  Mentation: Alert oriented to time, place, history taking. Follows all commands speech and language fluent Cranial nerve II-XII: Pupils were equal round reactive to light. Extraocular movements were full, visual field were full on confrontational test. Facial sensation and strength were normal. Uvula tongue midline. Head turning and shoulder shrug  were normal and symmetric. Motor: The motor testing reveals 5 over 5 strength of all 4 extremities. Good symmetric motor tone is noted throughout.  Sensory: Sensory testing is intact to soft touch on all 4 extremities. No evidence of extinction is noted.  Coordination: Cerebellar testing reveals good finger-nose-finger and heel-to-shin bilaterally.  Minimal tremor noted with finger-nose-finger. Gait and station: Gait is normal. Tandem gait is normal. Romberg is negative. No drift is seen.  Reflexes: Deep tendon reflexes are symmetric and normal bilaterally.   DIAGNOSTIC DATA (LABS, IMAGING, TESTING) - I reviewed patient records, labs, notes, testing and imaging myself where available.  Lab Results  Component Value Date   WBC 19.7 (H) 11/01/2016   HGB 12.0 11/01/2016   HCT 34.8 (L) 11/01/2016   MCV 97.5 11/01/2016   PLT 231 11/01/2016      Component Value Date/Time   NA 140 11/01/2016 2154   K 4.2 11/01/2016 2154  CL 110 11/01/2016 2154   CO2 24 11/01/2016 2154   GLUCOSE 78 11/01/2016 2154   BUN 15 11/01/2016 2154   CREATININE 1.14 (H) 11/01/2016 2154   CALCIUM 8.1 (L) 11/01/2016 2154   PROT  7.0 05/16/2016 1701   ALBUMIN 3.9 05/16/2016 1701   AST 29 05/16/2016 1701   ALT 22 05/16/2016 1701   ALKPHOS 64 05/16/2016 1701   BILITOT 1.1 05/16/2016 1701   GFRNONAA 58 (L) 11/01/2016 2154   GFRAA >60 11/01/2016 2154      ASSESSMENT AND PLAN 42 y.o. year old female  has a past medical history of Absence of menstruation, Agoraphobia with panic disorder, Anxiety, Attention deficit disorder without mention of hyperactivity, Backache, unspecified, Bipolar disorder ([place]), Body mass index 30.0-30.9, adult, Borderline personality disorder ([place]), Depressive disorder, not elsewhere classified, Diabetes mellitus without complication ([place]), Disturbance of skin sensation, Dyspepsia and other specified disorders of function of stomach, Insomnia, unspecified, Leukorrhea, not specified as infective, Night terrors, Other and unspecified hyperlipidemia, Other nonspecific finding on examination of urine, Other symptoms involving digestive system(787.99), Ovarian cancer ([place]), Palpitations, PTSD (post-traumatic stress disorder), Radial neuropathy, right (10/26/2015), Restless legs syndrome (RLS), Tobacco use disorder, Trichomonal vulvovaginitis, Unspecified essential hypertension, and Unspecified sleep apnea. here with:  1.  Tremor  The patient feels that her tremor has gotten no better with propranolol.  We discussed potentially trying a different medication such as Mysoline but the patient prefers not to go on any additional medication if possible.  She is requesting that we write a letter for her attorney-but she is unsure what the letter should actually say.  Patient advised that if her symptoms worsen or she develops new symptoms she should let us know.  She will follow-up in 6 months or sooner as needed   I spent 15 minutes with the patient. 50% of this time was spent discussing medication   Ward Givens, MSN, NP-C 04/18/2017, 1:33 PM [facility] [address] [place], [place] [address] [phone]

## 2017-04-18 NOTE — Patient Instructions (Signed)
Your Plan: ° °Continue propranolol °If your symptoms worsen or you develop new symptoms please let us know.  ° ° °Thank you for coming to see us at [facility]. I hope we have been able to provide you high quality care today. ° °You may receive a patient satisfaction survey over the next few weeks. We would appreciate your feedback and comments so that we may continue to improve ourselves and the health of our patients. ° °

## 2017-04-18 NOTE — Progress Notes (Signed)
I have read the note, and I agree with the clinical assessment and plan.  Charles K Willis   

## 2017-07-30 ENCOUNTER — Encounter (INDEPENDENT_AMBULATORY_CARE_PROVIDER_SITE_OTHER): Payer: Self-pay | Admitting: Vascular Surgery

## 2017-07-30 ENCOUNTER — Ambulatory Visit (INDEPENDENT_AMBULATORY_CARE_PROVIDER_SITE_OTHER): Payer: Medicare Other | Admitting: Vascular Surgery

## 2017-07-30 VITALS — BP 105/72 | HR 100 | Resp 18 | Ht 68.0 in | Wt 150.0 lb

## 2017-07-30 DIAGNOSIS — R600 Localized edema: Secondary | ICD-10-CM | POA: Diagnosis not present

## 2017-07-30 DIAGNOSIS — Z720 Tobacco use: Secondary | ICD-10-CM | POA: Diagnosis not present

## 2017-07-30 DIAGNOSIS — E785 Hyperlipidemia, unspecified: Secondary | ICD-10-CM | POA: Diagnosis not present

## 2017-07-31 ENCOUNTER — Encounter (INDEPENDENT_AMBULATORY_CARE_PROVIDER_SITE_OTHER): Payer: Self-pay | Admitting: Vascular Surgery

## 2017-07-31 NOTE — Progress Notes (Signed)
Subjective:    Patient ID: Judy Bailey, female    DOB: 12/21/1975, 42 y.o.   MRN: [id number] Chief Complaint  Patient presents with  . New Patient (Initial Visit)    ref bil le edema   Presents as a new patient referred by a PA MacLachlan for evaluation of lower extremity edema.  The patient endorses a long-standing history of intermittent swelling to the bilateral ankle and feet.  The patient notes that happens every couple weeks.  The patient states that she does engage in conservative therapy and wears medical grade 1 compression socks and elevates her legs on a daily basis however this has not provided any relief.  The patient notes that her edema is associated with discomfort which has progressed to the point that she is unable to function on a daily basis and has become lifestyle limiting.  The patient denies any recent surgery or trauma to the bilateral legs.  Patient denies any DVT history of the bilateral lower extremity.  The patient denies any claudication-like symptoms, rest pain or ulceration formation to the bilateral lower extremity.  The patient denies any fever, nausea vomiting.  Review of Systems  Constitutional: Negative.   HENT: Negative.   Eyes: Negative.   Respiratory: Negative.   Cardiovascular: Positive for leg swelling.  Gastrointestinal: Negative.   Endocrine: Negative.   Genitourinary: Negative.   Musculoskeletal: Negative.   Skin: Negative.   Allergic/Immunologic: Negative.   Neurological: Negative.   Hematological: Negative.   Psychiatric/Behavioral: Negative.       Objective:   Physical Exam  Constitutional: She is oriented to person, place, and time. She appears well-developed and well-nourished. No distress.  HENT:  Head: Normocephalic and atraumatic.  Right Ear: External ear normal.  Left Ear: External ear normal.  Eyes: Pupils are equal, round, and reactive to light. Conjunctivae are normal.  Neck: Normal range of motion.  Cardiovascular:  Normal rate, regular rhythm, normal heart sounds and intact distal pulses.  Pulses:      Radial pulses are 2+ on the right side, and 2+ on the left side.       Dorsalis pedis pulses are 2+ on the right side, and 2+ on the left side.       Posterior tibial pulses are 2+ on the right side, and 2+ on the left side.  Pulmonary/Chest: Effort normal and breath sounds normal.  Musculoskeletal: Normal range of motion. She exhibits no edema.  Neurological: She is alert and oriented to person, place, and time.  Skin: Skin is warm and dry. She is not diaphoretic.  Less than 1 cm scattered varicosities to the bilateral lower extremity.  There is no stasis dermatitis, fibrosis, cellulitis or active ulcerations noted at this time.  Psychiatric: She has a normal mood and affect. Her behavior is normal. Judgment and thought content normal.  Vitals reviewed.  BP 105/72 (BP Location: Right Arm)   Pulse 100   Resp 18   Ht 5\' 8"  (1.727 m)   Wt 150 lb (68 kg)   BMI 22.81 kg/m   Past Medical History:  Diagnosis Date  . Absence of menstruation   . Agoraphobia with panic disorder   . Anxiety   . Attention deficit disorder without mention of hyperactivity   . Backache, unspecified   . Bipolar disorder ([place])   . Body mass index 30.0-30.9, adult   . Borderline personality disorder ([place])   . Depressive disorder, not elsewhere classified   . Diabetes mellitus without complication (  [facility])   . Disturbance of skin sensation   . Dyspepsia and other specified disorders of function of stomach   . Insomnia, unspecified   . Leukorrhea, not specified as infective   . Night terrors   . Other and unspecified hyperlipidemia   . Other nonspecific finding on examination of urine   . Other symptoms involving digestive system(787.99)   . Ovarian cancer ([place])   . Palpitations   . PTSD (post-traumatic stress disorder)   . 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 Restless legs syndrome (RLS)   . Tobacco use disorder   .  Trichomonal vulvovaginitis   . Unspecified essential hypertension   . Unspecified sleep apnea    Social History   Socioeconomic History  . Marital status: Divorced    Spouse name: Not on file  . Number of children: 1  . Years of education: 21  . Highest education level: Not on file  Occupational History  . Occupation: Disability   Social Needs  . Financial resource strain: Not on file  . Food insecurity:    Worry: Not on file    Inability: Not on file  . Transportation needs:    Medical: Not on file    Non-medical: Not on file  Tobacco Use  . Smoking status: Current Every Day Smoker    Packs/day: 0.50    Years: 22.00    Pack years: 11.00    Types: Cigarettes  . Smokeless tobacco: Never Used  Substance and Sexual Activity  . Alcohol use: No    Alcohol/week: 0.0 oz  . Drug use: No  . Sexual activity: Not Currently    Partners: Male    Birth control/protection: Abstinence  Lifestyle  . Physical activity:    Days per week: Not on file    Minutes per session: Not on file  . Stress: Not on file  Relationships  . Social connections:    Talks on phone: Not on file    Gets together: Not on file    Attends religious service: Not on file    Active member of club or organization: Not on file    Attends meetings of clubs or organizations: Not on file    Relationship status: Not on file  . Intimate partner violence:    Fear of current or ex partner: Not on file    Emotionally abused: Not on file    Physically abused: Not on file    Forced sexual activity: Not on file  Other Topics Concern  . Not on file  Social History Narrative   Pt lives at home with mother and son.   Caffeine Use: 2 sodas weekly   Right handed    Past Surgical History:  Procedure Laterality Date  . KNEE CARTILAGE SURGERY     right  . tonsillectomy    . tympanic membrane  2009   Family History  Problem Relation Age of Onset  . Hypertension Father   . Diabetes Father   . Heart disease Father    . Depression Father   . Colon polyps Father   . Other Father        mad cow disease  . Colon polyps Mother   . Kidney disease Mother   . Irritable bowel syndrome Paternal Aunt   . Bipolar disorder Paternal Aunt   . Prostate cancer Maternal Grandfather   . Liver disease Maternal Grandfather   . Breast cancer Paternal Grandmother   . Diabetes Paternal Grandmother   . Bipolar  disorder Paternal Grandmother   . Alzheimer's disease Paternal Grandfather   . Parkinson's disease Paternal Grandfather   . Hypertension Brother   . Bipolar disorder Sister   . Colon polyps Brother   . Clotting disorder Maternal Grandmother   . Irritable bowel syndrome Sister   . Bipolar disorder Cousin    No Known Allergies     Assessment & Plan:  Presents as a new patient referred by a PA MacLachlan for evaluation of lower extremity edema.  The patient endorses a long-standing history of intermittent swelling to the bilateral ankle and feet.  The patient notes that happens every couple weeks.  The patient states that she does engage in conservative therapy and wears medical grade 1 compression socks and elevates her legs on a daily basis however this has not provided any relief.  The patient notes that her edema is associated with discomfort which has progressed to the point that she is unable to function on a daily basis and has become lifestyle limiting.  The patient denies any recent surgery or trauma to the bilateral legs.  Patient denies any DVT history of the bilateral lower extremity.  The patient denies any claudication-like symptoms, rest pain or ulceration formation to the bilateral lower extremity.  The patient denies any fever, nausea vomiting.  1. Bilateral lower extremity edema - New The patient was encouraged to wear graduated compression stockings (20-30 mmHg) on a daily basis. The patient was instructed to begin wearing the stockings first thing in the morning and removing them in the evening. The  patient was instructed specifically not to sleep in the stockings. Prescription given.  In addition, behavioral modification including elevation during the day will be initiated. Anti-inflammatories for pain. However the patient back in 3 months and have her undergo a bilateral lower extremity venous duplex to rule out any contributing venous versus lymphatic disease The patient will follow up in three months to asses conservative management.  Information on compression stockings was given to the patient. The patient was instructed to call the office in the interim if any worsening edema or ulcerations to the legs, feet or toes occurs. The patient expresses their understanding.  - VAS [place] LOWER EXTREMITY VENOUS REFLUX; Future  2. Tobacco user - Stable We had a discussion for approximately five minutes regarding the absolute need for smoking cessation due to the deleterious nature of tobacco on the vascular system. We discussed the tobacco use would diminish patency of any intervention, and likely significantly worsen progressio of disease. We discussed multiple agents for quitting including replacement therapy or medications to reduce cravings such as Chantix. The patient voices their understanding of the importance of smoking cessation.  3. Hyperlipidemia, unspecified hyperlipidemia type - Stable Encouraged good control as its slows the progression of atherosclerotic disease  Current Outpatient Medications on File Prior to Visit  Medication Sig Dispense Refill  . alprazolam (XANAX) 2 MG tablet Take 2 mg by mouth 3 (three) times daily as needed for sleep.     Marland Kitchen amphetamine-dextroamphetamine (ADDERALL XR) 30 MG 24 hr capsule Take 30 mg by mouth every morning.    Marland Kitchen amphetamine-dextroamphetamine (ADDERALL) 30 MG tablet Take 30 mg by mouth daily.     . Cholecalciferol (VITAMIN D3) 2000 units CHEW Chew 2,000 mg by mouth daily.    . Cyanocobalamin (VITAMIN B 12 PO) Take 1 tablet by mouth daily. 1500  MGS    . desvenlafaxine (PRISTIQ) 100 MG 24 hr tablet Take 1 tablet (100 mg total)  by mouth daily. 30 tablet 0  . lurasidone (LATUDA) 80 MG TABS tablet Take by mouth.    . zolpidem (AMBIEN) 10 MG tablet Take 20 mg by mouth at bedtime as needed for sleep.   0  . propranolol (INDERAL) 20 MG tablet Take 2 tablets (40 mg total) by mouth 2 (two) times daily. (Patient not taking: Reported on 07/30/2017) 120 tablet 3  . Tiotropium Bromide Monohydrate (SPIRIVA RESPIMAT) 2.5 MCG/ACT AERS Inhale 2 puffs into the lungs daily. (Patient not taking: Reported on 07/30/2017) 1 Inhaler 5   No current facility-administered medications on file prior to visit.    There are no Patient Instructions on file for this visit. No follow-ups on file.  Juanya Villavicencio A Cylis Ayars, PA-C

## 2017-10-23 ENCOUNTER — Telehealth: Payer: Self-pay | Admitting: Neurology

## 2017-10-23 ENCOUNTER — Ambulatory Visit: Payer: Medicare Other | Admitting: Neurology

## 2017-10-23 NOTE — Telephone Encounter (Signed)
This patient did not show for a RV appointment today. 

## 2017-10-24 ENCOUNTER — Encounter: Payer: Self-pay | Admitting: Neurology

## 2017-10-29 ENCOUNTER — Encounter (INDEPENDENT_AMBULATORY_CARE_PROVIDER_SITE_OTHER): Payer: Medicare Other

## 2017-10-29 ENCOUNTER — Ambulatory Visit (INDEPENDENT_AMBULATORY_CARE_PROVIDER_SITE_OTHER): Payer: Medicare Other | Admitting: Vascular Surgery

## 2017-11-29 IMAGING — CR DG CHEST 2V
2 series · 2 of 2 positions shown · non-contrast
Comparison: None.

CLINICAL DATA: Chest pain

EXAM:
CHEST  2 VIEW

[w chest pa]
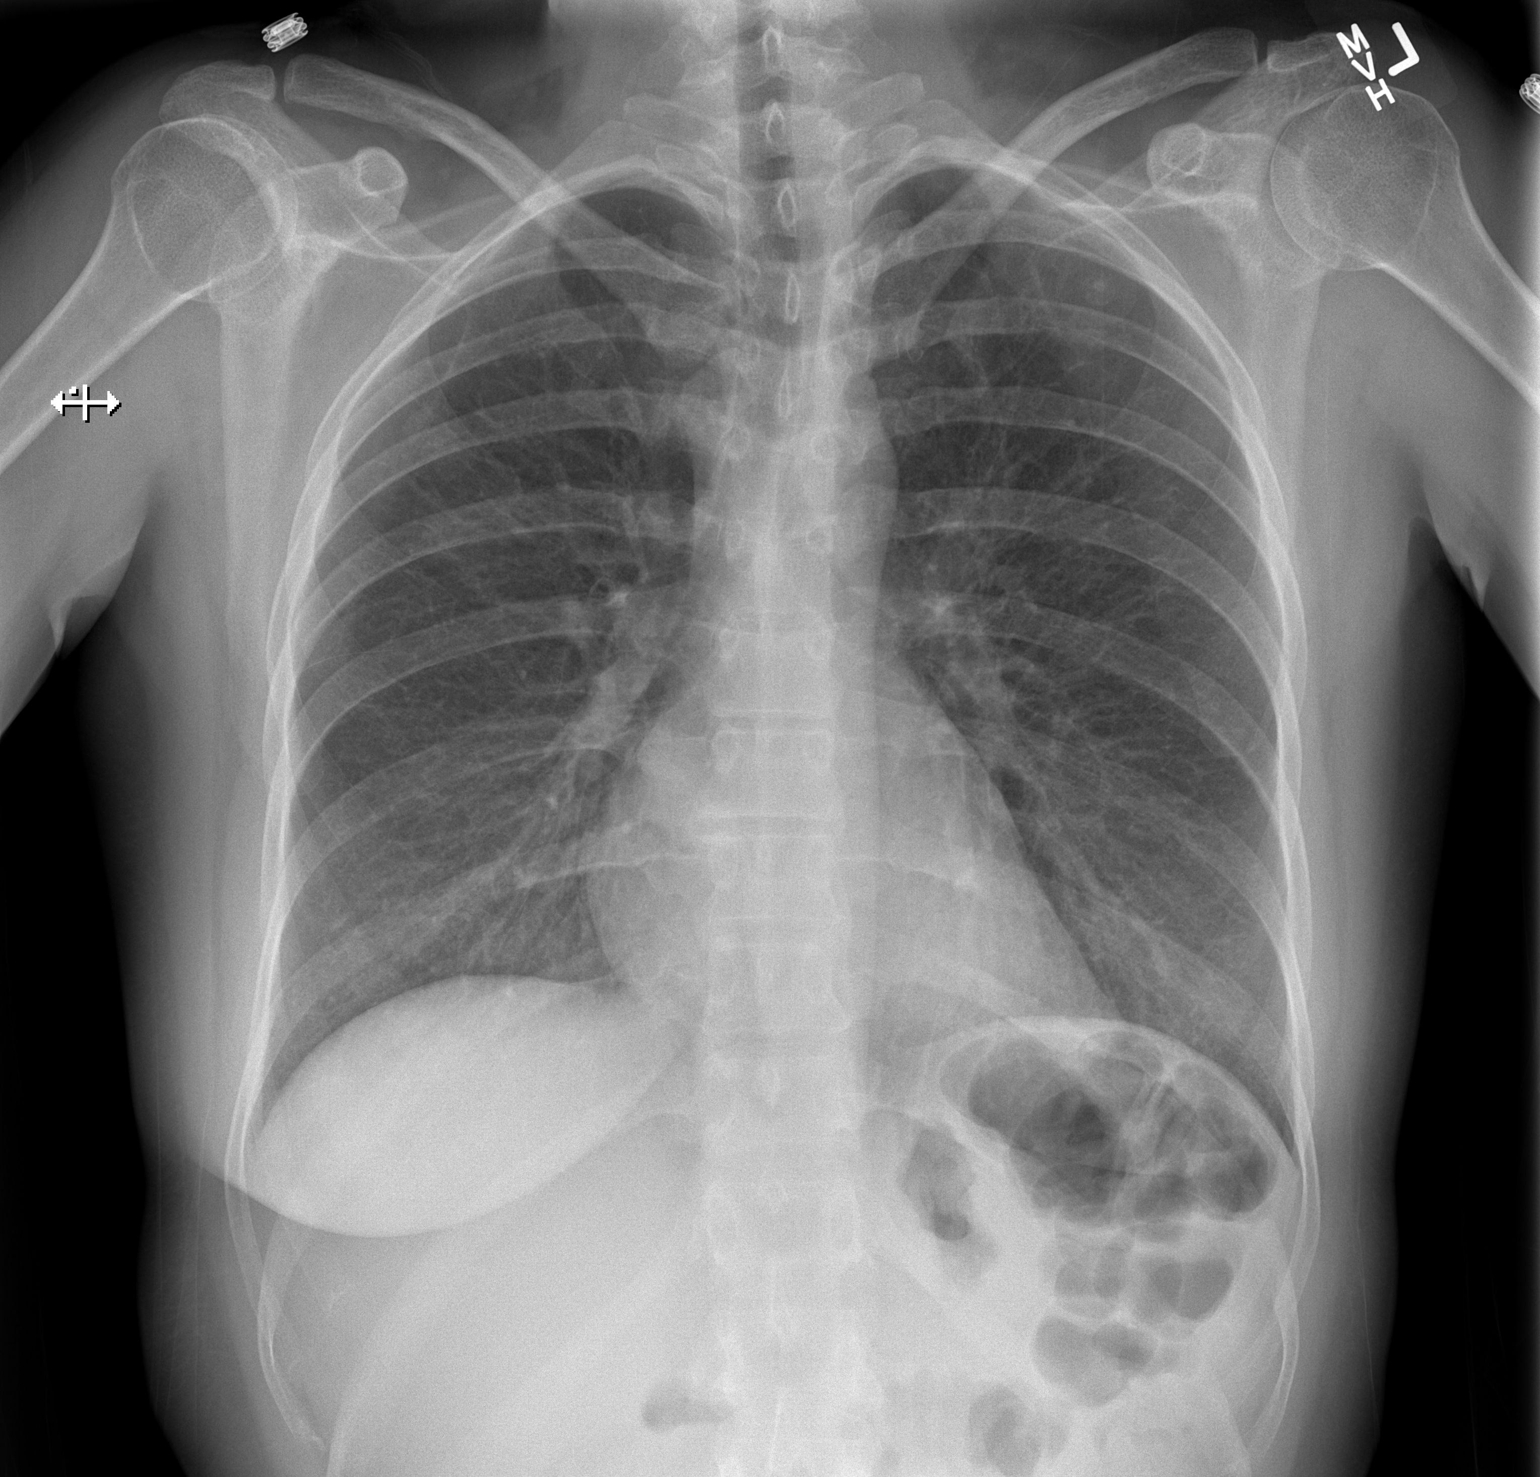

[w chest lat]
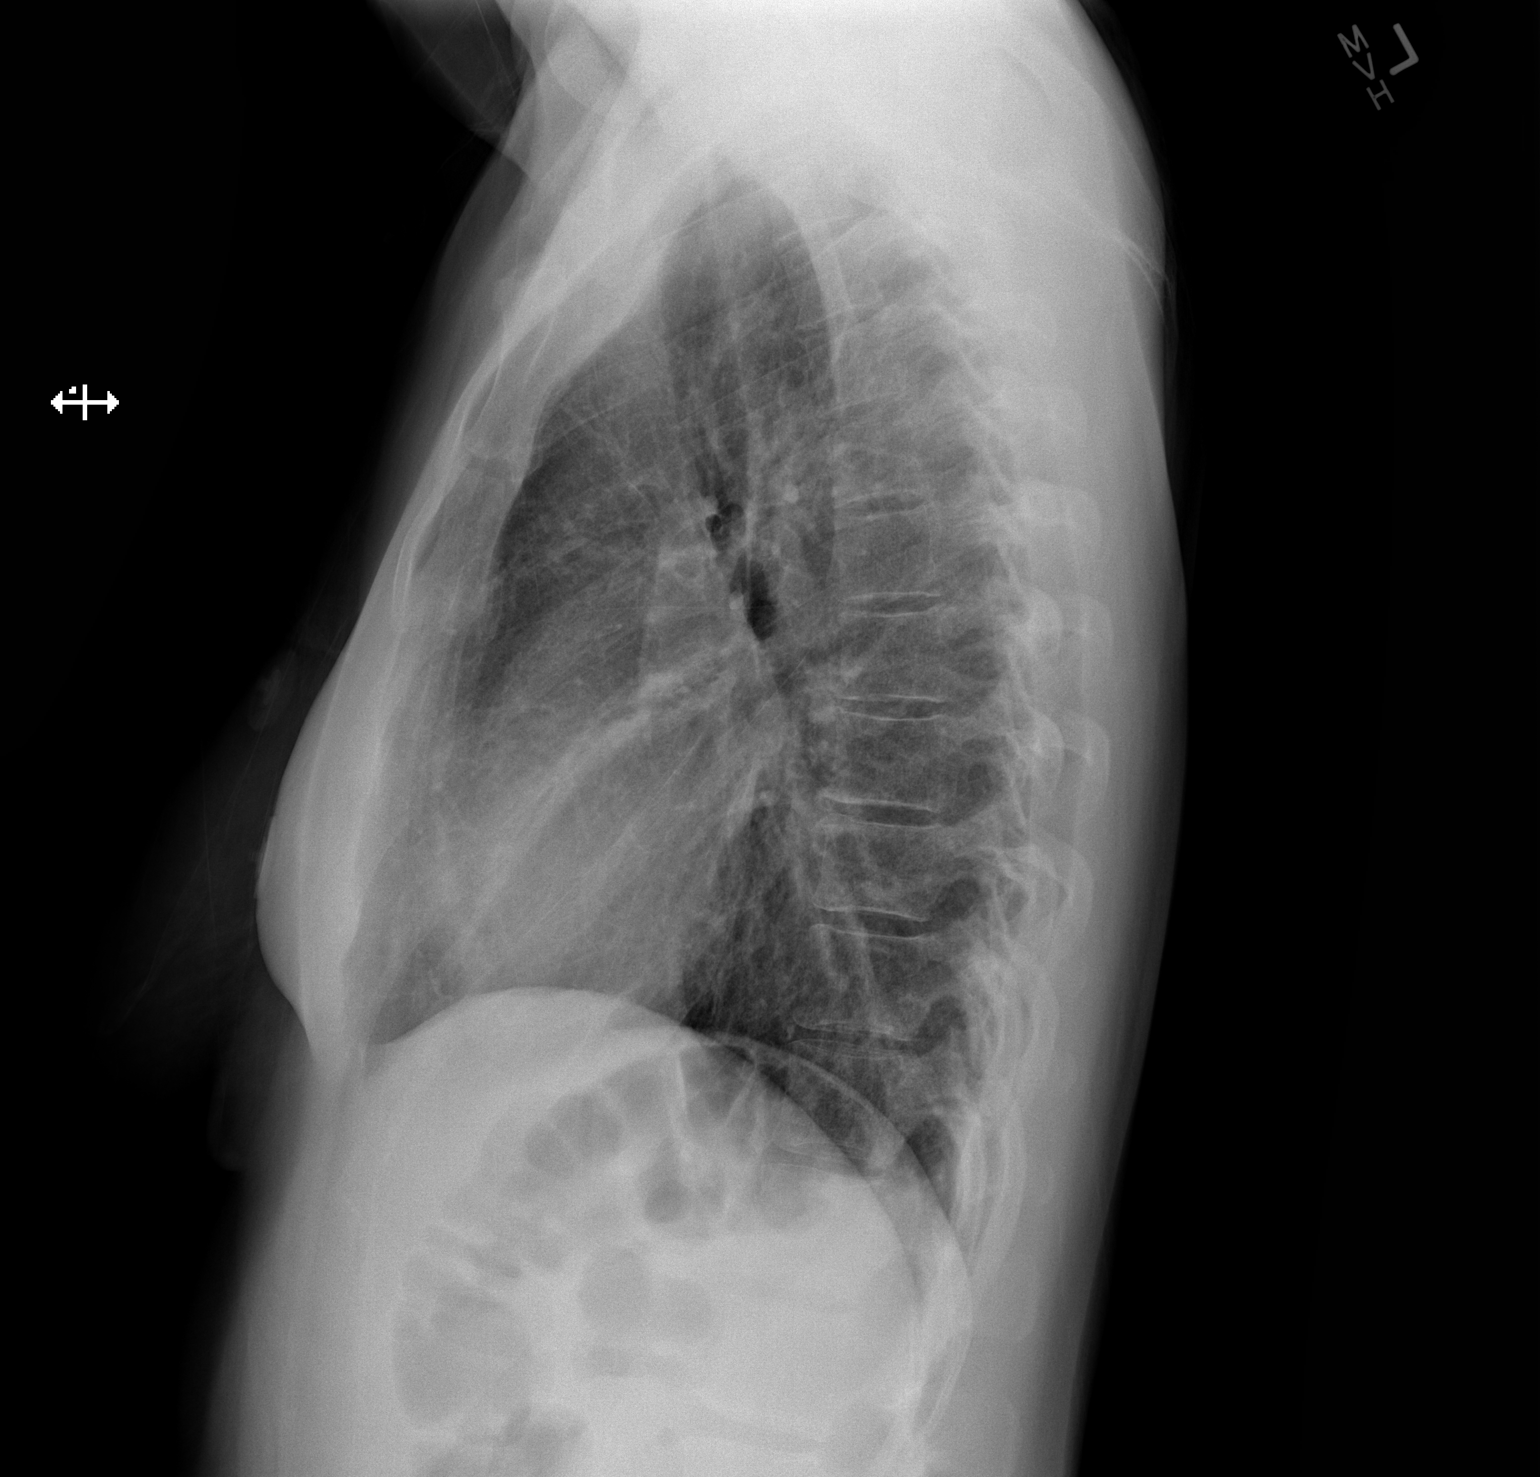

[2 of 2 positions shown; findings below may reference images not displayed]

FINDINGS: The heart size and mediastinal contours are within normal limits.
Both lungs are clear. The visualized skeletal structures are
unremarkable.
IMPRESSION: No active cardiopulmonary disease.

## 2017-12-13 ENCOUNTER — Ambulatory Visit: Payer: Self-pay

## 2017-12-13 ENCOUNTER — Inpatient Hospital Stay: Payer: Medicare Other | Attending: Oncology | Admitting: Genetics

## 2017-12-31 ENCOUNTER — Encounter

## 2017-12-31 ENCOUNTER — Encounter (INDEPENDENT_AMBULATORY_CARE_PROVIDER_SITE_OTHER): Payer: Medicare Other

## 2017-12-31 ENCOUNTER — Ambulatory Visit (INDEPENDENT_AMBULATORY_CARE_PROVIDER_SITE_OTHER): Payer: Medicare Other | Admitting: Vascular Surgery

## 2018-01-02 ENCOUNTER — Ambulatory Visit (INDEPENDENT_AMBULATORY_CARE_PROVIDER_SITE_OTHER): Payer: Medicare Other | Admitting: Neurology

## 2018-01-02 ENCOUNTER — Encounter: Payer: Self-pay | Admitting: Neurology

## 2018-01-02 VITALS — BP 104/69 | HR 88 | Ht 68.0 in | Wt 155.0 lb

## 2018-01-02 DIAGNOSIS — R251 Tremor, unspecified: Secondary | ICD-10-CM

## 2018-01-02 NOTE — Progress Notes (Signed)
Reason for visit: Tremor  Judy Bailey is an 42 y.o. female  History of present illness:  Judy Bailey is a 42 year old right-handed white female with a history of bipolar disorder who had originally been evaluated for physiologic tremors.  The patient had been placed on propranolol but she did not note any benefit with the tremors.  She has miraculously resolved the tremors, she has gone off of the propranolol.  She comes in with a 28-month history of intermittent pain involving the Achilles tendon on the right, she will have episodes with pain that will be very severe and make her fall down, she will rest a little bit and the pain will go away and she can walk again.  The patient is wearing an ankle support brace with some benefit.  The patient has some tenderness around the insertion point of the Achilles tendon to the heel.  The patient comes back to this office for further evaluation.  The patient claims that for over 5 months ago that she once again awakened with a wrist drop on the right, she sleeps with her arm behind her back in an unusual position.  The wrist drop has resolved with exception of some mild numbness in the radial dorsal portion of the hand.  Past Medical History:  Diagnosis Date  . Absence of menstruation   . Agoraphobia with panic disorder   . Anxiety   . Attention deficit disorder without mention of hyperactivity   . Backache, unspecified   . Bipolar disorder ([place])   . Body mass index 30.0-30.9, adult   . Borderline personality disorder ([place])   . Depressive disorder, not elsewhere classified   . Diabetes mellitus without complication ([place])   . Disturbance of skin sensation   . Dyspepsia and other specified disorders of function of stomach   . Insomnia, unspecified   . Leukorrhea, not specified as infective   . Night terrors   . Other and unspecified hyperlipidemia   . Other nonspecific finding on examination of urine   . Other symptoms involving digestive  system(787.99)   . Ovarian cancer ([place])   . Palpitations   . PTSD (post-traumatic stress disorder)   . 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 Restless legs syndrome (RLS)   . Tobacco use disorder   . Trichomonal vulvovaginitis   . Unspecified essential hypertension   . Unspecified sleep apnea     Past Surgical History:  Procedure Laterality Date  . KNEE CARTILAGE SURGERY     right  . tonsillectomy    . tympanic membrane  2009    Family History  Problem Relation Age of Onset  . Hypertension Father   . Diabetes Father   . Heart disease Father   . Depression Father   . Colon polyps Father   . Other Father        mad cow disease  . Colon polyps Mother   . Kidney disease Mother   . Irritable bowel syndrome Paternal Aunt   . Bipolar disorder Paternal Aunt   . Prostate cancer Maternal Grandfather   . Liver disease Maternal Grandfather   . Breast cancer Paternal Grandmother   . Diabetes Paternal Grandmother   . Bipolar disorder Paternal Grandmother   . Alzheimer's disease Paternal Grandfather   . Parkinson's disease Paternal Grandfather   . Hypertension Brother   . Bipolar disorder Sister   . Colon polyps Brother   . Clotting disorder Maternal Grandmother   . Irritable bowel syndrome Sister   .  Bipolar disorder Cousin     Social history:  reports that she has been smoking cigarettes. She has a 11.00 pack-year smoking history. She has never used smokeless tobacco. She reports that she does not drink alcohol or use drugs.   No Known Allergies  Medications:  Prior to Admission medications   Medication Sig Start Date End Date Taking? Authorizing Provider  alprazolam Duanne Moron) 2 MG tablet Take 2 mg by mouth 3 (three) times daily as needed for sleep.     [provider]  amphetamine-dextroamphetamine (ADDERALL XR) 30 MG 24 hr capsule Take 30 mg by mouth every morning.    [provider]  amphetamine-dextroamphetamine (ADDERALL) 30 MG tablet Take 30 mg by  mouth daily.     [provider]  Cyanocobalamin (VITAMIN B 12 PO) Take 1 tablet by mouth daily. 1500 MGS    [provider]  desvenlafaxine (PRISTIQ) 100 MG 24 hr tablet Take 1 tablet (100 mg total) by mouth daily. 09/03/12   Arfeen, Arlyce Harman, MD  propranolol (INDERAL) 20 MG tablet Take 2 tablets (40 mg total) by mouth 2 (two) times daily. Patient not taking: Reported on 07/30/2017 02/27/17   Kathrynn Ducking, MD  zolpidem (AMBIEN) 10 MG tablet Take 20 mg by mouth at bedtime as needed for sleep.  09/22/16   [provider]    ROS:  Out of a complete 14 system review of symptoms, the patient complains only of the following symptoms, and all other reviewed systems are negative.  Leg swelling Restless legs, insomnia, sleep apnea, frequent waking, daytime sleepiness, snoring, sleep talking, acting out dreams Cold intolerance, excessive thirst Joint pain, walking difficulty Memory loss, dizziness, numbness, tremors Depression, anxiety  Blood pressure 104/69, pulse 88, height 5\' 8"  (1.727 m), weight 155 lb (70.3 kg).  Physical Exam  General: The patient is alert and cooperative at the time of the examination.  Skin: No significant peripheral edema is noted.   Neurologic Exam  Mental status: The patient is alert and oriented x 3 at the time of the examination. The patient has apparent normal recent and remote memory, with an apparently normal attention span and concentration ability.   Cranial nerves: Facial symmetry is present. Speech is normal, no aphasia or dysarthria is noted. Extraocular movements are full. Visual fields are full.  Motor: The patient has good strength in all 4 extremities.  No evidence of a wrist drop or weakness with finger extension is noted on the right.  Sensory examination: Soft touch sensation is symmetric on the face, arms, and legs.  Coordination: The patient has good finger-nose-finger and heel-to-shin bilaterally.  No tremors noted  with arms outstretched.  Gait and station: The patient has a normal gait. Tandem gait is normal. Romberg is negative. No drift is seen.  The patient is able to walk on heels and the toes bilaterally.  Reflexes: Deep tendon reflexes are symmetric.   Assessment/Plan:  1.  Physiologic tremor, resolved  2.  Pain, right Achilles tendon insertion  The patient will go on Aleve taking 2 tablets twice daily for the next 4 to 6 weeks, she will take magnesium supplementation as there may be an associated muscle cramp in the lower leg as well, she will call if any further issues arise, the patient may require orthopedic evaluation for the right ankle in the future.  She will follow-up here if needed.  Jill Alexanders MD 01/02/2018 9:45 AM  [facility] [address] [place],  [place] [address]  Phone [phone] Fax [phone]

## 2018-01-02 NOTE — Patient Instructions (Signed)
Try magnesium suplimentation for the right heel pain.  Take 2 Alleve twice a day for the right heel pain.

## 2018-05-01 ENCOUNTER — Ambulatory Visit (INDEPENDENT_AMBULATORY_CARE_PROVIDER_SITE_OTHER): Payer: Medicare Other | Admitting: Psychiatry

## 2018-05-01 ENCOUNTER — Other Ambulatory Visit: Payer: Self-pay

## 2018-05-01 ENCOUNTER — Encounter: Payer: Self-pay | Admitting: Psychiatry

## 2018-05-01 DIAGNOSIS — F1121 Opioid dependence, in remission: Secondary | ICD-10-CM

## 2018-05-01 DIAGNOSIS — F3161 Bipolar disorder, current episode mixed, mild: Secondary | ICD-10-CM

## 2018-05-01 DIAGNOSIS — N946 Dysmenorrhea, unspecified: Secondary | ICD-10-CM | POA: Insufficient documentation

## 2018-05-01 DIAGNOSIS — F1211 Cannabis abuse, in remission: Secondary | ICD-10-CM

## 2018-05-01 DIAGNOSIS — F1021 Alcohol dependence, in remission: Secondary | ICD-10-CM | POA: Diagnosis not present

## 2018-05-01 DIAGNOSIS — F410 Panic disorder [episodic paroxysmal anxiety] without agoraphobia: Secondary | ICD-10-CM | POA: Diagnosis not present

## 2018-05-01 DIAGNOSIS — F1320 Sedative, hypnotic or anxiolytic dependence, uncomplicated: Secondary | ICD-10-CM

## 2018-05-01 DIAGNOSIS — F900 Attention-deficit hyperactivity disorder, predominantly inattentive type: Secondary | ICD-10-CM
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
## 2018-05-01 NOTE — Progress Notes (Signed)
TC on 05-01-18 @ 1:36 pt medical and surgical hx was reviewed and updated. Pt allergies were reviewed with no changes. Pt medical hx and pharmacy was reviewed and updated. Pt vitals were not taken because this was a phone visit.

## 2018-09-19 ENCOUNTER — Encounter
Admission: RE | Admit: 2018-09-19 | Discharge: 2018-09-19 | Disposition: A | Discharge status: Home / Self Care | Payer: Medicare Other | Source: Ambulatory Visit | Attending: Obstetrics & Gynecology | Admitting: Obstetrics & Gynecology

## 2018-09-19 ENCOUNTER — Other Ambulatory Visit: Payer: Medicare Other

## 2018-09-19 ENCOUNTER — Other Ambulatory Visit: Payer: Self-pay

## 2018-09-19 HISTORY — DX: Personal history of urinary calculi: Z87.442

## 2018-09-19 HISTORY — DX: Chronic obstructive pulmonary disease, unspecified: J44.9

## 2018-09-19 NOTE — Patient Instructions (Addendum)
Your procedure is scheduled on: Friday 09/27/18 FRIDAY Report to [facility] ON 2ND FLOOR MEDICAL MALL ENTRANCE. To find out your arrival time please call [phone] between 1PM - 3PM on Thursday 09/26/18 THURSDAY  Remember: Instructions that are not followed completely may result in serious medical risk, up to and including death, or upon the discretion of your surgeon and anesthesiologist your surgery may need to be rescheduled.     _X__ 1. Do not eat food after midnight the night before your procedure.                 No gum chewing or hard candies. You may drink clear liquids up to 2 hours                 before you are scheduled to arrive for your surgery- DO not drink clear                 liquids within 2 hours of the start of your surgery.                 Clear Liquids include:  water, apple juice without pulp, clear carbohydrate                 drink such as Clearfast or Gatorade, Black Coffee or Tea (Do not add                 anything to coffee or tea). Diabetics water only  __X__2.  On the morning of surgery brush your teeth with toothpaste and water, you may rinse your mouth with mouthwash if you wish.  Do not swallow any toothpaste of mouthwash.     _X__ 3.  No Alcohol for 24 hours before or after surgery.   _X__ 4.  Do Not Smoke or use e-cigarettes For 24 Hours Prior to Your Surgery.                 Do not use any chewable tobacco products for at least 6 hours prior to                 surgery.  ____  5.  Bring all medications with you on the day of surgery if instructed.   __X__  6.  Notify your doctor if there is any change in your medical condition      (cold, fever, infections).     Do not wear jewelry, make-up, hairpins, clips or nail polish. Do not wear lotions, powders, or perfumes.  Do not shave 48 hours prior to surgery. Men may shave face and neck. Do not bring valuables to the hospital.    [facility] is not responsible for any belongings or  valuables.  Contacts, dentures/partials or body piercings may not be worn into surgery. Bring a case for your contacts, glasses or hearing aids, a denture cup will be supplied. Leave your suitcase in the car. After surgery it may be brought to your room. For patients admitted to the hospital, discharge time is determined by your treatment team.   Patients discharged the day of surgery will not be allowed to drive home.   Please read over the following fact sheets that you were given:   MRSA Information  __X__ Take these medicines the morning of surgery with A SIP OF WATER:    1. PRISTIQ  2. YOU MAY TAKE XANAX THE MORNING OF SURGERY WITH A SMALL OF WATER  3.   4.  5.  6.  ____ Fleet Enema (as directed)   ____ Use CHG Soap/SAGE wipes as directed  ____ Use inhalers on the day of surgery  ____ Stop metformin/Janumet/Farxiga 2 days prior to surgery    ____ Take 1/2 of usual insulin dose the night before surgery. No insulin the morning          of surgery.   ____ Stop Blood Thinners Coumadin/Plavix/Xarelto/Pleta/Pradaxa/Eliquis/Effient/Aspirin  on   Or contact your Surgeon, Cardiologist or Medical Doctor regarding  ability to stop your blood thinners  __X__ Stop Anti-inflammatories 7 days before surgery such as Advil, Ibuprofen, Motrin,BC or Goodies Powder, Naprosyn, Naproxen, Aleve, Aspirin-OK TO TAKE TYLENOL IF NEEDED   __X__ Stop all herbal supplements, fish oil or vitamin E until after surgery.    ____ Bring C-Pap to the hospital.

## 2018-09-19 NOTE — Pre-Procedure Instructions (Signed)
Echo complete7/08/2017 [place] Component Name Value Ref Range  LV Ejection Fraction (%) 55   Aortic Valve Stenosis Grade none   Aortic Valve Regurgitation Grade trivial   Mitral Valve Stenosis Grade none   Mitral Valve Regurgitation Grade trivial   Tricuspid Valve Regurgitation Grade trivial   LV End Diastolic Diameter (cm) 3.6 cm  LV End Systolic Diameter (cm) 2.7 cm  LV Septum Wall Thickness (cm) 0.6 cm  LV Posterior Wall Thickness (cm) 0.98 cm  Left Atrium Diameter (cm) 2.5 cm  Result Narrative          INTERNAL MEDICINE DEPARTMENT         [place], [place]                  [id number]      [place] #: [id number]      [address] [place], [place] [address]   Date: 07/30/2017 05: 09 PM                                Adult  Female  Age: 43 yrs      ECHOCARDIOGRAM REPORT               Outpatient                                KC^^KCWI    STUDY:CHEST WALL        TAPE:0000: 00: 0: 00: 00 MD1: MCLAUGHLIN, MIRIAM KLEM    ECHO:Yes  DOPPLER:Yes    FILE:0000-000-000    BP: 126/80 mmHg    COLOR:Yes  CONTRAST:No   MACHINE:Philips  RV BIOPSY:No     3D:No SOUND QLTY:Moderate      Height: 68 in   MEDIUM:None                       Weight: 148 lb                                BSA: 1.8 m2 _________________________________________________________________________________________        HISTORY: DOE         REASON: Assess, LV function       INDICATION: Edema extremities [R60.0 (ICD-10-CM)] _________________________________________________________________________________________ ECHOCARDIOGRAPHIC MEASUREMENTS 2D DIMENSIONS AORTA         Values  Normal  Range  MAIN PA     Values  Normal Range        Annulus: nm*     [2.1-2.5]     PA Main: nm*    [1.5-2.1]       Aorta Sin: nm*     [2.7-3.3]  RIGHT VENTRICLE      ST Junction: nm*     [2.3-2.9]     RV Base: 3.1 cm  [<4.2]       Asc.Aorta: nm*     [2.3-3.1]     RV Mid: nm*    [<3.5] LEFT VENTRICLE                   RV Length: nm*    [<8.6]         LVIDd: 3.6 cm    [3.9-5.3]  INFERIOR  VENA CAVA         LVIDs: 2.7 cm            Max. IVC: nm*    [<=2.1]           FS: 26.1 %    [>25]      Min. IVC: nm*          SWT: 0.60 cm   [0.5-0.9]  ------------------          PWT: 0.98 cm   [0.5-0.9]  nm* - not measured LEFT ATRIUM        LA Diam: 2.5 cm    [2.7-3.8]      LA A4C Area: nm*     [<20]       LA Volume: nm*     [22-52] _________________________________________________________________________________________ ECHOCARDIOGRAPHIC DESCRIPTIONS AORTIC ROOT          Size: Normal       Dissection: INDETERM FOR DISSECTION AORTIC VALVE        Leaflets: Tricuspid          Morphology: Normal        Mobility: Fully mobile LEFT VENTRICLE          Size: Normal            Anterior: Normal      Contraction: Normal             Lateral: Normal       Closest EF: >55% (Estimated)        Septal: Normal       LV Masses: No Masses            Apical: Normal          LVH: None             Inferior: Normal                           Posterior: Normal      Dias.FxClass: (Grade 1) relaxation abnormal, E/A reversal MITRAL VALVE        Leaflets: Normal            Mobility: Fully mobile       Morphology: Normal LEFT ATRIUM           Size: Normal            LA Masses: No masses       IA Septum: Normal IAS MAIN PA          Size: Normal PULMONIC VALVE       Morphology: Normal            Mobility: Fully mobile RIGHT VENTRICLE       RV Masses: No Masses             Size: Normal       Free Wall: Normal           Contraction: Normal TRICUSPID VALVE        Leaflets: Normal            Mobility: Fully mobile       Morphology: Normal RIGHT ATRIUM          Size: Normal            RA Other: None        RA Mass: No masses PERICARDIUM         Fluid: No effusion INFERIOR VENACAVA  Size: Normal Normal respiratory collapse _________________________________________________________________________________________  DOPPLER ECHO and OTHER SPECIAL PROCEDURES         Aortic: TRIVIAL AR         No AS         Mitral: TRIVIAL MR         No MS             MV Inflow E Vel = 89.2 cm/sec   MV Annulus E'Vel = 8.5 cm/sec             E/E'Ratio = 10.5       Tricuspid: TRIVIAL TR         No TS       Pulmonary: No PR           No PS _________________________________________________________________________________________ INTERPRETATION NORMAL LEFT VENTRICULAR SYSTOLIC FUNCTION NORMAL RIGHT VENTRICULAR SYSTOLIC FUNCTION TRIVIAL REGURGITATION NOTED (See above) NO VALVULAR STENOSIS _________________________________________________________________________________________ Electronically signed by   Rusty Aus, MD on 07/30/2017 06: 00 PM      Performed By: Scherrie November, RCS   Ordering Physician: Paulita Cradle _________________________________________________________________________________________  Other Result Information  Interface, Text Results In - 07/30/2017  6:01 PM EDT                  INTERNAL MEDICINE DEPARTMENT                 CAISYN, BALTER           [facility]                                    [id number]           [place] #: [id number]           [address] [place],  [place] [address]      Date: 07/30/2017 05: 09 PM                                                              Adult   Female   Age: 43 yrs           ECHOCARDIOGRAM REPORT                              Outpatient                                                              KC^^KCWI      STUDY:CHEST WALL               TAPE:0000: 00: 0: 00: 00 MD1: MCLAUGHLIN, MIRIAM KLEM       ECHO:Yes    DOPPLER:Yes       FILE:0000-000-000        BP: 126/80 mmHg      COLOR:Yes  CONTRAST:No     MACHINE:Philips  RV BIOPSY:No          3D:No  SOUND QLTY:Moderate            Height: 68 in     MEDIUM:None                                              Weight: 148 lb                                                              BSA: 1.8 m2 _________________________________________________________________________________________               HISTORY: DOE                REASON: Assess, LV function            INDICATION: Edema extremities [R60.0 (ICD-10-CM)] _________________________________________________________________________________________ ECHOCARDIOGRAPHIC MEASUREMENTS 2D DIMENSIONS AORTA                  Values   Normal Range   MAIN PA         Values    Normal Range               Annulus: nm*          [2.1-2.5]         PA Main: nm*       [1.5-2.1]             Aorta Sin: nm*          [2.7-3.3]    RIGHT VENTRICLE           ST Junction: nm*          [2.3-2.9]         RV Base: 3.1 cm    [<4.2]             Asc.Aorta: nm*          [2.3-3.1]          RV Mid: nm*       [<3.5] LEFT VENTRICLE                                      RV Length: nm*       [<8.6]                 LVIDd: 3.6 cm       [3.9-5.3]    INFERIOR VENA CAVA                 LVIDs: 2.7 cm                         Max. IVC: nm*       [<=2.1]                    FS: 26.1 %       [>25]            Min. IVC: nm*  SWT: 0.60 cm      [0.5-0.9]    ------------------                   PWT: 0.98 cm      [0.5-0.9]    nm* - not measured LEFT ATRIUM               LA Diam: 2.5 cm       [2.7-3.8]           LA A4C Area: nm*          [<20]             LA Volume: nm*          [22-52] _________________________________________________________________________________________ ECHOCARDIOGRAPHIC DESCRIPTIONS AORTIC ROOT                  Size: Normal            Dissection: INDETERM FOR DISSECTION AORTIC VALVE              Leaflets: Tricuspid                   Morphology: Normal              Mobility: Fully mobile LEFT VENTRICLE                  Size: Normal                        Anterior: Normal           Contraction: Normal                         Lateral: Normal            Closest EF: >55% (Estimated)                Septal: Normal             LV Masses: No Masses                       Apical: Normal                   LVH: None                          Inferior: Normal                                                     Posterior: Normal          Dias.FxClass: (Grade 1) relaxation abnormal, E/A reversal MITRAL VALVE              Leaflets: Normal                        Mobility: Fully mobile            Morphology: Normal LEFT ATRIUM                  Size: Normal                       LA Masses: No masses  IA Septum: Normal IAS MAIN PA                  Size: Normal PULMONIC VALVE            Morphology: Normal                        Mobility: Fully mobile RIGHT VENTRICLE             RV Masses: No Masses                         Size: Normal             Free Wall: Normal                     Contraction: Normal TRICUSPID VALVE              Leaflets: Normal                        Mobility: Fully mobile            Morphology: Normal RIGHT ATRIUM                  Size: Normal                         RA Other: None               RA Mass: No masses PERICARDIUM                 Fluid: No effusion INFERIOR VENACAVA                  Size: Normal Normal respiratory collapse _________________________________________________________________________________________  DOPPLER ECHO and OTHER SPECIAL PROCEDURES                Aortic: TRIVIAL AR                 No AS                Mitral: TRIVIAL MR                 No MS                        MV Inflow E Vel = 89.2 cm/sec     MV Annulus E'Vel = 8.5 cm/sec                        E/E'Ratio = 10.5             Tricuspid: TRIVIAL TR                 No TS             Pulmonary: No PR                      No PS _________________________________________________________________________________________ INTERPRETATION NORMAL LEFT VENTRICULAR SYSTOLIC FUNCTION NORMAL RIGHT VENTRICULAR SYSTOLIC FUNCTION TRIVIAL REGURGITATION NOTED (See above) NO VALVULAR STENOSIS _________________________________________________________________________________________ Electronically signed by      Rusty Aus, MD on 07/30/2017 06: 00 PM          Performed By: Scherrie November, RCS    Ordering Physician: Paulita Cradle _________________________________________________________________________________________  Status Results Details   Encounter Summary

## 2018-09-24 ENCOUNTER — Other Ambulatory Visit: Payer: Medicare Other

## 2018-09-24 ENCOUNTER — Other Ambulatory Visit: Payer: Self-pay

## 2018-09-24 ENCOUNTER — Encounter
Admission: RE | Admit: 2018-09-24 | Discharge: 2018-09-24 | Disposition: A | Discharge status: Home / Self Care | Payer: Medicare Other | Source: Ambulatory Visit | Attending: Obstetrics & Gynecology | Admitting: Obstetrics & Gynecology

## 2018-09-24 DIAGNOSIS — G4730 Sleep apnea, unspecified: Secondary | ICD-10-CM | POA: Diagnosis not present

## 2018-09-24 DIAGNOSIS — Z8249 Family history of ischemic heart disease and other diseases of the circulatory system: Secondary | ICD-10-CM | POA: Diagnosis not present

## 2018-09-24 DIAGNOSIS — Z841 Family history of disorders of kidney and ureter: Secondary | ICD-10-CM | POA: Diagnosis not present

## 2018-09-24 DIAGNOSIS — N946 Dysmenorrhea, unspecified: Secondary | ICD-10-CM | POA: Diagnosis not present

## 2018-09-24 DIAGNOSIS — Z87442 Personal history of urinary calculi: Secondary | ICD-10-CM | POA: Diagnosis not present

## 2018-09-24 DIAGNOSIS — F319 Bipolar disorder, unspecified: Secondary | ICD-10-CM | POA: Diagnosis not present

## 2018-09-24 DIAGNOSIS — G4700 Insomnia, unspecified: Secondary | ICD-10-CM | POA: Diagnosis not present

## 2018-09-24 DIAGNOSIS — F4310 Post-traumatic stress disorder, unspecified: Secondary | ICD-10-CM | POA: Diagnosis not present

## 2018-09-24 DIAGNOSIS — G2581 Restless legs syndrome: Secondary | ICD-10-CM | POA: Diagnosis not present

## 2018-09-24 DIAGNOSIS — F17210 Nicotine dependence, cigarettes, uncomplicated: Secondary | ICD-10-CM | POA: Diagnosis not present

## 2018-09-24 DIAGNOSIS — Z833 Family history of diabetes mellitus: Secondary | ICD-10-CM | POA: Diagnosis not present

## 2018-09-24 DIAGNOSIS — E785 Hyperlipidemia, unspecified: Secondary | ICD-10-CM | POA: Diagnosis not present

## 2018-09-24 DIAGNOSIS — E119 Type 2 diabetes mellitus without complications: Secondary | ICD-10-CM | POA: Diagnosis not present

## 2018-09-24 DIAGNOSIS — Z832 Family history of diseases of the blood and blood-forming organs and certain disorders involving the immune mechanism: Secondary | ICD-10-CM | POA: Diagnosis not present

## 2018-09-24 DIAGNOSIS — F603 Borderline personality disorder: Secondary | ICD-10-CM | POA: Diagnosis not present

## 2018-09-24 DIAGNOSIS — F419 Anxiety disorder, unspecified: Secondary | ICD-10-CM | POA: Diagnosis not present

## 2018-09-24 DIAGNOSIS — Z8042 Family history of malignant neoplasm of prostate: Secondary | ICD-10-CM | POA: Diagnosis not present

## 2018-09-24 DIAGNOSIS — Z820 Family history of epilepsy and other diseases of the nervous system: Secondary | ICD-10-CM | POA: Diagnosis not present

## 2018-09-24 DIAGNOSIS — F4001 Agoraphobia with panic disorder: Secondary | ICD-10-CM | POA: Diagnosis not present

## 2018-09-24 DIAGNOSIS — J449 Chronic obstructive pulmonary disease, unspecified: Secondary | ICD-10-CM | POA: Diagnosis not present

## 2018-09-24 DIAGNOSIS — N920 Excessive and frequent menstruation with regular cycle: Secondary | ICD-10-CM | POA: Diagnosis not present

## 2018-09-24 DIAGNOSIS — R002 Palpitations: Secondary | ICD-10-CM | POA: Diagnosis not present

## 2018-09-24 DIAGNOSIS — Z803 Family history of malignant neoplasm of breast: Secondary | ICD-10-CM | POA: Diagnosis not present

## 2018-09-24 DIAGNOSIS — Z8371 Family history of colonic polyps: Secondary | ICD-10-CM | POA: Diagnosis not present

## 2018-09-24 LAB — BASIC METABOLIC PANEL
Anion gap: 10 (ref 5–15)
BUN: 18 mg/dL (ref 6–20)
CO2: 25 mmol/L (ref 22–32)
Calcium: 8.9 mg/dL (ref 8.9–10.3)
Chloride: 103 mmol/L (ref 98–111)
Creatinine, Ser: 1.06 mg/dL — ABNORMAL HIGH (ref 0.44–1.00)
GFR calc Af Amer: >60 mL/min (ref >60)
GFR calc non Af Amer: >60 mL/min (ref >60)
Glucose, Bld: 116 mg/dL — ABNORMAL HIGH (ref 70–99)
Potassium: 3.5 mmol/L (ref 3.5–5.1)
Sodium: 138 mmol/L (ref 135–145)

## 2018-09-24 LAB — SARS CORONAVIRUS 2 (TAT 6-24 HRS): SARS Coronavirus 2: NEGATIVE

## 2018-09-24 LAB — CBC
HCT: 39 % (ref 36.0–46.0)
Hemoglobin: 13.1 g/dL (ref 12.0–15.0)
MCH: 32 pg (ref 26.0–34.0)
MCHC: 33.6 g/dL (ref 30.0–36.0)
MCV: 95.4 fL (ref 80.0–100.0)
Platelets: 258 10*3/uL (ref 150–400)
RBC: 4.09 MIL/uL (ref 3.87–5.11)
RDW: 12 % (ref 11.5–15.5)
WBC: 6.8 10*3/uL (ref 4.0–10.5)
nRBC: 0 % (ref 0.0–0.2)

## 2018-09-26 LAB — TYPE AND SCREEN
ABO/RH(D): A POS
Antibody Screen: NEGATIVE

## 2018-09-27 ENCOUNTER — Other Ambulatory Visit: Payer: Self-pay

## 2018-09-27 ENCOUNTER — Ambulatory Visit: Payer: Medicare Other | Admitting: Certified Registered Nurse Anesthetist

## 2018-09-27 ENCOUNTER — Ambulatory Visit
Admission: RE | Admit: 2018-09-27 | Discharge: 2018-09-27 | Disposition: A | Discharge status: Home / Self Care | Payer: Medicare Other | Attending: Obstetrics & Gynecology | Admitting: Obstetrics & Gynecology

## 2018-09-27 ENCOUNTER — Encounter
Admission: RE | Disposition: A | Discharge status: Home / Self Care | Payer: Self-pay | Source: Home / Self Care | Attending: Obstetrics & Gynecology

## 2018-09-27 ENCOUNTER — Encounter: Payer: Self-pay | Admitting: *Deleted

## 2018-09-27 DIAGNOSIS — J449 Chronic obstructive pulmonary disease, unspecified: Secondary | ICD-10-CM | POA: Insufficient documentation

## 2018-09-27 DIAGNOSIS — Z820 Family history of epilepsy and other diseases of the nervous system: Secondary | ICD-10-CM | POA: Insufficient documentation

## 2018-09-27 DIAGNOSIS — G4730 Sleep apnea, unspecified: Secondary | ICD-10-CM | POA: Insufficient documentation

## 2018-09-27 DIAGNOSIS — Z841 Family history of disorders of kidney and ureter: Secondary | ICD-10-CM | POA: Insufficient documentation

## 2018-09-27 DIAGNOSIS — F4001 Agoraphobia with panic disorder: Secondary | ICD-10-CM | POA: Insufficient documentation

## 2018-09-27 DIAGNOSIS — Z87442 Personal history of urinary calculi: Secondary | ICD-10-CM | POA: Insufficient documentation

## 2018-09-27 DIAGNOSIS — N920 Excessive and frequent menstruation with regular cycle: Secondary | ICD-10-CM | POA: Insufficient documentation

## 2018-09-27 DIAGNOSIS — Z803 Family history of malignant neoplasm of breast: Secondary | ICD-10-CM | POA: Insufficient documentation

## 2018-09-27 DIAGNOSIS — F4310 Post-traumatic stress disorder, unspecified: Secondary | ICD-10-CM | POA: Insufficient documentation

## 2018-09-27 DIAGNOSIS — F17210 Nicotine dependence, cigarettes, uncomplicated: Secondary | ICD-10-CM | POA: Insufficient documentation

## 2018-09-27 DIAGNOSIS — F319 Bipolar disorder, unspecified: Secondary | ICD-10-CM | POA: Insufficient documentation

## 2018-09-27 DIAGNOSIS — Z8489 Family history of other specified conditions: Secondary | ICD-10-CM | POA: Insufficient documentation

## 2018-09-27 DIAGNOSIS — Z833 Family history of diabetes mellitus: Secondary | ICD-10-CM | POA: Insufficient documentation

## 2018-09-27 DIAGNOSIS — F419 Anxiety disorder, unspecified: Secondary | ICD-10-CM | POA: Insufficient documentation

## 2018-09-27 DIAGNOSIS — G4700 Insomnia, unspecified: Secondary | ICD-10-CM | POA: Insufficient documentation

## 2018-09-27 DIAGNOSIS — Z8249 Family history of ischemic heart disease and other diseases of the circulatory system: Secondary | ICD-10-CM | POA: Insufficient documentation

## 2018-09-27 DIAGNOSIS — N921 Excessive and frequent menstruation with irregular cycle: Secondary | ICD-10-CM | POA: Diagnosis present

## 2018-09-27 DIAGNOSIS — Z832 Family history of diseases of the blood and blood-forming organs and certain disorders involving the immune mechanism: Secondary | ICD-10-CM | POA: Insufficient documentation

## 2018-09-27 DIAGNOSIS — N946 Dysmenorrhea, unspecified: Secondary | ICD-10-CM | POA: Diagnosis not present

## 2018-09-27 DIAGNOSIS — Z8042 Family history of malignant neoplasm of prostate: Secondary | ICD-10-CM | POA: Insufficient documentation

## 2018-09-27 DIAGNOSIS — E119 Type 2 diabetes mellitus without complications: Secondary | ICD-10-CM | POA: Insufficient documentation

## 2018-09-27 DIAGNOSIS — F603 Borderline personality disorder: Secondary | ICD-10-CM | POA: Insufficient documentation

## 2018-09-27 DIAGNOSIS — E785 Hyperlipidemia, unspecified: Secondary | ICD-10-CM | POA: Insufficient documentation

## 2018-09-27 DIAGNOSIS — Z818 Family history of other mental and behavioral disorders: Secondary | ICD-10-CM | POA: Insufficient documentation

## 2018-09-27 DIAGNOSIS — R002 Palpitations: Secondary | ICD-10-CM | POA: Insufficient documentation

## 2018-09-27 DIAGNOSIS — G2581 Restless legs syndrome: Secondary | ICD-10-CM | POA: Insufficient documentation

## 2018-09-27 DIAGNOSIS — Z8371 Family history of colonic polyps: Secondary | ICD-10-CM | POA: Insufficient documentation

## 2018-09-27 HISTORY — PX: ENDOMETRIAL ABLATION: SHX621

## 2018-09-27 HISTORY — PX: HYSTEROSCOPY WITH NOVASURE: SHX5574

## 2018-09-27 LAB — POCT PREGNANCY, URINE: Preg Test, Ur: NEGATIVE

## 2018-09-27 LAB — URINE DRUG SCREEN, QUALITATIVE (ARMC ONLY)
Amphetamines, Ur Screen: NOT DETECTED
Barbiturates, Ur Screen: NOT DETECTED
Benzodiazepine, Ur Scrn: POSITIVE — AB
Cannabinoid 50 Ng, Ur ~~LOC~~: NOT DETECTED
Cocaine Metabolite,Ur ~~LOC~~: NOT DETECTED
MDMA (Ecstasy)Ur Screen: NOT DETECTED
Methadone Scn, Ur: NOT DETECTED
Opiate, Ur Screen: POSITIVE — AB
Phencyclidine (PCP) Ur S: NOT DETECTED
Tricyclic, Ur Screen: NOT DETECTED

## 2018-09-27 LAB — PREGNANCY, URINE: Preg Test, Ur: NEGATIVE

## 2018-09-27 LAB — ABO/RH: ABO/RH(D): A POS

## 2018-09-27 SURGERY — HYSTEROSCOPY WITH NOVASURE
Anesthesia: General

## 2018-09-27 MED ORDER — ONDANSETRON HCL 4 MG/2ML IJ SOLN
INTRAMUSCULAR | Status: AC
  Filled 2018-09-27: qty 2

## 2018-09-27 MED ORDER — LIDOCAINE HCL (PF) 2 % IJ SOLN
INTRAMUSCULAR | Status: AC
  Filled 2018-09-27: qty 10

## 2018-09-27 MED ORDER — GLYCOPYRROLATE 0.2 MG/ML IJ SOLN
INTRAMUSCULAR | Status: DC | PRN
  Administered 2018-09-27: 0.2 mg via INTRAVENOUS

## 2018-09-27 MED ORDER — GLYCOPYRROLATE 0.2 MG/ML IJ SOLN
INTRAMUSCULAR | Status: AC
  Filled 2018-09-27: qty 1

## 2018-09-27 MED ORDER — CELECOXIB 200 MG PO CAPS
400.0000 mg | ORAL_CAPSULE | ORAL | Status: AC
  Administered 2018-09-27: 12:00:00 400 mg via ORAL

## 2018-09-27 MED ORDER — PROPOFOL 10 MG/ML IV BOLUS
INTRAVENOUS | Status: DC | PRN
  Administered 2018-09-27: 180 mg via INTRAVENOUS
  Administered 2018-09-27: 20 mg via INTRAVENOUS
  Administered 2018-09-27: 40 mg via INTRAVENOUS

## 2018-09-27 MED ORDER — ACETAMINOPHEN 650 MG RE SUPP: 650.0000 mg | RECTAL | Status: DC | PRN

## 2018-09-27 MED ORDER — FENTANYL CITRATE (PF) 100 MCG/2ML IJ SOLN
INTRAMUSCULAR | Status: DC | PRN
  Administered 2018-09-27 (×4): 25 ug via INTRAVENOUS

## 2018-09-27 MED ORDER — MORPHINE SULFATE (PF) 4 MG/ML IV SOLN: 1.0000 mg | INTRAVENOUS | Status: DC | PRN

## 2018-09-27 MED ORDER — GABAPENTIN 300 MG PO CAPS: 300.0000 mg | ORAL_CAPSULE | ORAL | Status: DC

## 2018-09-27 MED ORDER — DEXAMETHASONE SODIUM PHOSPHATE 10 MG/ML IJ SOLN
INTRAMUSCULAR | Status: AC
  Filled 2018-09-27: qty 1

## 2018-09-27 MED ORDER — ACETAMINOPHEN 500 MG PO TABS
ORAL_TABLET | ORAL | Status: AC
  Administered 2018-09-27: 12:00:00 1000 mg via ORAL
  Filled 2018-09-27: qty 2

## 2018-09-27 MED ORDER — ONDANSETRON HCL 4 MG/2ML IJ SOLN
INTRAMUSCULAR | Status: DC | PRN
  Administered 2018-09-27: 4 mg via INTRAVENOUS

## 2018-09-27 MED ORDER — ACETAMINOPHEN 325 MG PO TABS: 650.0000 mg | ORAL_TABLET | ORAL | Status: DC | PRN

## 2018-09-27 MED ORDER — FENTANYL CITRATE (PF) 100 MCG/2ML IJ SOLN
INTRAMUSCULAR | Status: AC
  Filled 2018-09-27: qty 2

## 2018-09-27 MED ORDER — SILVER NITRATE-POT NITRATE 75-25 % EX MISC
CUTANEOUS | Status: DC | PRN
  Administered 2018-09-27: 1

## 2018-09-27 MED ORDER — MIDAZOLAM HCL 2 MG/2ML IJ SOLN
INTRAMUSCULAR | Status: AC
  Filled 2018-09-27: qty 2

## 2018-09-27 MED ORDER — FENTANYL CITRATE (PF) 100 MCG/2ML IJ SOLN
25.0000 ug | INTRAMUSCULAR | Status: DC | PRN
  Administered 2018-09-27 (×4): 25 ug via INTRAVENOUS

## 2018-09-27 MED ORDER — DEXMEDETOMIDINE HCL IN NACL 200 MCG/50ML IV SOLN
INTRAVENOUS | Status: DC | PRN
  Administered 2018-09-27: 20 ug via INTRAVENOUS

## 2018-09-27 MED ORDER — LACTATED RINGERS IV SOLN
INTRAVENOUS | Status: DC
  Administered 2018-09-27 (×2): via INTRAVENOUS

## 2018-09-27 MED ORDER — MIDAZOLAM HCL 2 MG/2ML IJ SOLN
INTRAMUSCULAR | Status: DC | PRN
  Administered 2018-09-27: 2 mg via INTRAVENOUS

## 2018-09-27 MED ORDER — LIDOCAINE HCL (CARDIAC) PF 100 MG/5ML IV SOSY
PREFILLED_SYRINGE | INTRAVENOUS | Status: DC | PRN
  Administered 2018-09-27: 100 mg via INTRAVENOUS

## 2018-09-27 MED ORDER — IBUPROFEN 800 MG PO TABS: 800.0000 mg | ORAL_TABLET | Freq: Four times a day (QID) | ORAL | 0 refills | Status: AC | PRN

## 2018-09-27 MED ORDER — LACTATED RINGERS IV SOLN: INTRAVENOUS | Status: DC

## 2018-09-27 MED ORDER — GABAPENTIN 300 MG PO CAPS
ORAL_CAPSULE | ORAL | Status: AC
  Filled 2018-09-27: qty 1

## 2018-09-27 MED ORDER — PROPOFOL 10 MG/ML IV BOLUS
INTRAVENOUS | Status: AC
  Filled 2018-09-27: qty 20

## 2018-09-27 MED ORDER — SILVER NITRATE-POT NITRATE 75-25 % EX MISC
CUTANEOUS | Status: AC
  Filled 2018-09-27: qty 1

## 2018-09-27 MED ORDER — SCOPOLAMINE 1 MG/3DAYS TD PT72
1.0000 | MEDICATED_PATCH | TRANSDERMAL | Status: DC
  Administered 2018-09-27: 12:00:00 1.5 mg via TRANSDERMAL

## 2018-09-27 MED ORDER — SCOPOLAMINE 1 MG/3DAYS TD PT72
MEDICATED_PATCH | TRANSDERMAL | Status: AC
  Administered 2018-09-27: 12:00:00 1.5 mg via TRANSDERMAL
  Filled 2018-09-27: qty 1

## 2018-09-27 MED ORDER — FENTANYL CITRATE (PF) 100 MCG/2ML IJ SOLN
INTRAMUSCULAR | Status: AC
  Administered 2018-09-27: 25 ug via INTRAVENOUS
  Filled 2018-09-27: qty 2

## 2018-09-27 MED ORDER — ONDANSETRON HCL 4 MG/2ML IJ SOLN: 4.0000 mg | Freq: Once | INTRAMUSCULAR | Status: DC | PRN

## 2018-09-27 MED ORDER — CELECOXIB 200 MG PO CAPS
ORAL_CAPSULE | ORAL | Status: AC
  Administered 2018-09-27: 400 mg via ORAL
  Filled 2018-09-27: qty 2

## 2018-09-27 MED ORDER — KETOROLAC TROMETHAMINE 30 MG/ML IJ SOLN: 30.0000 mg | Freq: Four times a day (QID) | INTRAMUSCULAR | Status: DC

## 2018-09-27 MED ORDER — DEXAMETHASONE SODIUM PHOSPHATE 10 MG/ML IJ SOLN
INTRAMUSCULAR | Status: DC | PRN
  Administered 2018-09-27: 10 mg via INTRAVENOUS

## 2018-09-27 MED ORDER — FAMOTIDINE 20 MG PO TABS
20.0000 mg | ORAL_TABLET | Freq: Once | ORAL | Status: AC
  Administered 2018-09-27: 12:00:00 20 mg via ORAL

## 2018-09-27 MED ORDER — ACETAMINOPHEN 500 MG PO TABS
1000.0000 mg | ORAL_TABLET | ORAL | Status: AC
  Administered 2018-09-27: 12:00:00 1000 mg via ORAL

## 2018-09-27 MED ORDER — FAMOTIDINE 20 MG PO TABS
ORAL_TABLET | ORAL | Status: AC
  Administered 2018-09-27: 20 mg via ORAL
  Filled 2018-09-27: qty 1

## 2018-09-27 SURGICAL SUPPLY — 17 items
ABLATOR SURESOUND NOVASURE (ABLATOR) ×4 IMPLANT
CATH ROBINSON RED A/P 16FR (CATHETERS) ×4 IMPLANT
COVER WAND RF STERILE (DRAPES) ×4 IMPLANT
GLOVE PI ORTHOPRO 6.5 (GLOVE) ×2
GLOVE PI ORTHOPRO STRL 6.5 (GLOVE) ×2 IMPLANT
GLOVE SURG SYN 6.5 ES PF (GLOVE) ×4 IMPLANT
GLOVE SURG SYN 6.5 PF PI (GLOVE) ×2 IMPLANT
GOWN STRL REUS W/ TWL LRG LVL3 (GOWN DISPOSABLE) ×4 IMPLANT
GOWN STRL REUS W/TWL LRG LVL3 (GOWN DISPOSABLE) ×8
IV LACTATED RINGER IRRG 3000ML (IV SOLUTION) ×4
IV LR IRRIG 3000ML ARTHROMATIC (IV SOLUTION) ×2 IMPLANT
KIT PROCEDURE FLUENT (KITS) IMPLANT
NS IRRIG 500ML POUR BTL (IV SOLUTION) ×4 IMPLANT
PACK DNC HYST (MISCELLANEOUS) ×4 IMPLANT
PAD OB MATERNITY 4.3X12.25 (PERSONAL CARE ITEMS) ×4 IMPLANT
TUBING CONNECTING 10 (TUBING) ×3 IMPLANT
TUBING CONNECTING 10' (TUBING) ×1

## 2018-09-27 NOTE — Discharge Instructions (Addendum)
You should expect to have some cramping and vaginal bleeding or gray/black discharge for about a week. This should taper off and subside, much like a period. If heavy bleeding occurs, you should contact the office for an earlier appointment.   Please call the office or physician on call for fever >101, severe pain, and heavy bleeding.   [place]!!      AMBULATORY SURGERY  DISCHARGE INSTRUCTIONS   1) The drugs that you were given will stay in your system until tomorrow so for the next 24 hours you should not:  A) Drive an automobile B) Make any legal decisions C) Drink any alcoholic beverage   2) You may resume regular meals tomorrow.  Today it is better to start with liquids and gradually work up to solid foods.  You may eat anything you prefer, but it is better to start with liquids, then soup and crackers, and gradually work up to solid foods.   3) Please notify your doctor immediately if you have any unusual bleeding, trouble breathing, redness and pain at the surgery site, drainage, fever, or pain not relieved by medication.    4) Additional Instructions:        Please contact your physician with any problems or Same Day Surgery at [phone], Monday through Friday 6 am to 4 pm, or [place] at [facility] number at [phone].

## 2018-09-27 NOTE — Anesthesia Postprocedure Evaluation (Signed)
Anesthesia Post Note  Patient: Judy Bailey  Procedure(s) Performed: HYSTEROSCOPY WITH NOVASURE (N/A ) ENDOMETRIAL ABLATION  Patient location during evaluation: PACU Anesthesia Type: General Level of consciousness: awake and alert Pain management: pain level controlled Vital Signs Assessment: post-procedure vital signs reviewed and stable Respiratory status: spontaneous breathing, nonlabored ventilation, respiratory function stable and patient connected to nasal cannula oxygen Cardiovascular status: blood pressure returned to baseline and stable Postop Assessment: no apparent nausea or vomiting Anesthetic complications: no     Last Vitals:  Vitals:   09/27/18 1737 09/27/18 1750  BP: 118/67 119/73  Pulse: 60 64  Resp: 16 16  Temp: 36.9 C (!) 36.4 C  SpO2: 96% 100%    Last Pain:  Vitals:   09/27/18 1750  TempSrc: Temporal  PainSc: 5                  Precious Haws Voula Waln

## 2018-09-27 NOTE — Anesthesia Preprocedure Evaluation (Signed)
Anesthesia Evaluation  Patient identified by MRN, date of birth, ID band Patient awake    Reviewed: Allergy & Precautions, NPO status , [name] Chart, lab work & pertinent test results  History of Anesthesia Complications Negative for: history of anesthetic complications  Airway Mallampati: II       Dental   Pulmonary sleep apnea (mild, no CPAP ordered) , neg COPD, Current Smoker,           Cardiovascular (-) hypertension     Neuro/Psych neg Seizures Anxiety Depression Bipolar Disorder    GI/Hepatic Neg liver ROS, neg GERD  ,  Endo/Other  neg diabetes (hx of Gestational diabetes 20 years ago)  Renal/GU Renal disease (stones)     Musculoskeletal   Abdominal   Peds  Hematology   Anesthesia Other Findings   Reproductive/Obstetrics                             Anesthesia Physical Anesthesia Plan  ASA: II  Anesthesia Plan: General   Post-op Pain Management:    Induction: Intravenous  PONV Risk Score and Plan: 2 and Dexamethasone and Ondansetron  Airway Management Planned: LMA  Additional Equipment:   Intra-op Plan:   Post-operative Plan:   Informed Consent: I have reviewed the patients History and Physical, chart, labs and discussed the procedure including the risks, benefits and alternatives for the proposed anesthesia with the patient or authorized representative who has indicated his/her understanding and acceptance.       Plan Discussed with:   Anesthesia Plan Comments:         Anesthesia Quick Evaluation

## 2018-09-27 NOTE — Op Note (Signed)
  ENDOMETRIAL ABLATION PROCEDURE NOTE   PROCEDURE DATE: 09/27/2018  PATIENT:  Judy Bailey  43 y.o. female  PRE-OPERATIVE DIAGNOSIS:  Dysmenorrhea, menorrhagia  POST-OPERATIVE DIAGNOSIS:  Dysmenorrhea, menorrhagia  PROCEDURE:  Procedure(s): HYSTEROSCOPY WITH NOVASURE (N/A) ENDOMETRIAL ABLATION  SURGEON:  Surgeon(s) and Role:    * Kavitha Lansdale, Honor Loh, MD - Primary  ANESTHESIA:  General via ET  I/O  Total I/O In: 1250 [P.O.:50; I.V.:1200] Out: - minimal EBL, no UOP  FINDINGS:   Normal vulva Normal vagina Normal cervix Uterus sounded to 7.5cm Cervical length 3.5cm, Uterus 4.0cm length, 2.6cm width Patent ostea bilaterally Good char post ablation  SPECIMEN: none  COMPLICATIONS: none apparent  DISPOSITION: vital signs stable to PACU   Indication for Surgery: 43 y.o. G1P1001 with heavy painful periods and desire for non-hormonal intervention.  Endometrial biopsy benign. Patient has had a tubal ligation.  Risks, benefits, alternatives, and limitations of procedure explained to patient, including pain, bleeding, infection, failure to resolve pain or bleeding, need for additional procedures, damage to pelvic organs. Informed written consent was obtained. All questions were answered. Time out performed. Urine pregnancy test was negative.  Procedure: The patient was placed in lithotomy position and the bivalved speculum was placed in the patient's vagina. A grounding pad placed on the patient. A single tooth tenaculum was placed on the cervix at 12:00.  The uterus sounded to 7.5cm, and the cervix was dilated to accommodate the myoscope.  Findings as above.  The camera was removed and the novasure was inserted and settings were entered into the device.  For 1 minute and 20 seconds, the cautery was deployed and spontaneously stopped when the impedence feedback was appropriate.  The novasure was removed from the uterus, and the camera reinserted to confirm adequate char.    All  instruments were removed.  Silver Nitrate was applied to the tenaculum site for hemostasis.  Instrument, sponge and needle counts were correct x2.  The patient tolerated the procedure and was brought to PACU in a stable condition.   Post-operative instructions given to patient, including instruction to seek medical attention for persistent bright red bleeding, fever, abdominal/pelvic pain, dysuria, nausea or vomiting. She was also told about the possibility of having black tinged discharge. She was counseled to avoid anything in the vagina (sex/douching/tampons) for 4 weeks. She has a 4 week post-operative check to assess wound healing, review results and discuss further management.   ----- Larey Days, MD Attending Obstetrician and [facility]

## 2018-09-27 NOTE — H&P (Signed)
Preoperative History and Physical  Judy Bailey is a 43 y.o. G1P1001 here for surgical management of dysmenorrhea, menorrhagia..   No significant preoperative concerns.  EMB benign  Proposed surgery: endometrial ablation , hysteroscopy  Past Medical History:  Diagnosis Date  . Absence of menstruation   . Agoraphobia with panic disorder   . Anxiety   . Attention deficit disorder without mention of hyperactivity   . Backache, unspecified   . Bipolar disorder ([place])   . Body mass index 30.0-30.9, adult   . Borderline personality disorder ([place])   . COPD (chronic obstructive pulmonary disease) ([place])   . Depressive disorder, not elsewhere classified   . Diabetes mellitus without complication ([place])    GESTATIONAL DM  . Disturbance of skin sensation   . Dyspepsia and other specified disorders of function of stomach   . History of kidney stones    CURRENTLY AS OF 09-19-18  . Insomnia, unspecified   . Leukorrhea, not specified as infective   . Night terrors   . Other and unspecified hyperlipidemia   . Other nonspecific finding on examination of urine   . Other symptoms involving digestive system(787.99)   . Palpitations   . PTSD (post-traumatic stress disorder)   . 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 Restless legs syndrome (RLS)   . Tobacco use disorder   . Trichomonal vulvovaginitis   . Unspecified sleep apnea    NO CPAP   Past Surgical History:  Procedure Laterality Date  . INNER EAR SURGERY Right   . KNEE CARTILAGE SURGERY     right  . tonsillectomy    . tympanic membrane  2009   OB History  Gravida Para Term Preterm AB Living  1 1 1     1   SAB TAB Ectopic Multiple Live Births          1    # Outcome Date GA Lbr Len/2nd Weight Sex Delivery Anes PTL Lv  1 Term 10/01/96 [redacted]w[redacted]d  3884 g M Vag-Spont EPI  LIV  Patient denies any other pertinent gynecologic issues.   No current facility-administered medications on file prior to encounter.    Current Outpatient  Medications on File Prior to Encounter  Medication Sig Dispense Refill  . desvenlafaxine (PRISTIQ) 100 MG 24 hr tablet Take 1 tablet (100 mg total) by mouth daily. (Patient taking differently: Take 100 mg by mouth every morning. ) 30 tablet 0  . zolpidem (AMBIEN) 10 MG tablet Take 10 mg by mouth at bedtime as needed for sleep.   0  . amphetamine-dextroamphetamine (ADDERALL XR) 30 MG 24 hr capsule Take 30 mg by mouth every morning.     No Known Allergies  Social History:   reports that she has been smoking cigarettes. She has a 22.00 pack-year smoking history. She has never used smokeless tobacco. She reports current drug use. Drug: Marijuana. She reports that she does not drink alcohol.  Family History  Problem Relation Age of Onset  . Hypertension Father   . Diabetes Father   . Heart disease Father   . Depression Father   . Colon polyps Father   . Other Father        mad cow disease  . Colon polyps Mother   . Kidney disease Mother   . Irritable bowel syndrome Paternal Aunt   . Bipolar disorder Paternal Aunt   . Prostate cancer Maternal Grandfather   . Liver disease Maternal Grandfather   . Breast cancer Paternal Grandmother   .  Diabetes Paternal Grandmother   . Bipolar disorder Paternal Grandmother   . Alzheimer's disease Paternal Grandfather   . Parkinson's disease Paternal Grandfather   . Hypertension Brother   . Bipolar disorder Sister   . Clotting disorder Maternal Grandmother   . Bipolar disorder Cousin   . Drug abuse Cousin   . Schizophrenia Maternal Aunt     Review of Systems: Noncontributory  PHYSICAL EXAM: Blood pressure 119/77, pulse 82, temperature (!) 96.6 F (35.9 C), temperature source Temporal, resp. rate 18, height 5\' 9"  (1.753 m), weight 79.4 kg, last menstrual period 08/27/2018, SpO2 99 %. General appearance - alert, well appearing, and in no distress Chest - clear to auscultation, no wheezes, rales or rhonchi, symmetric air entry Heart - normal rate  and regular rhythm Abdomen - soft, nontender, nondistended, no masses or organomegaly Pelvic - examination not indicated Extremities - peripheral pulses normal, no pedal edema, no clubbing or cyanosis  Labs: Results for orders placed or performed during the hospital encounter of 09/27/18 (from the past 336 hour(s))  ABO/Rh   Collection Time: 09/27/18 11:55 AM  Result Value Ref Range   ABO/RH(D)      A POS Performed at [facility], [address] [place], [place] [address]   Urine Drug Screen, Qualitative [facility] only)   Collection Time: 09/27/18 12:14 PM  Result Value Ref Range   Tricyclic, Ur Screen NONE DETECTED NONE DETECTED   Amphetamines, Ur Screen NONE DETECTED NONE DETECTED   MDMA (Ecstasy)Ur Screen NONE DETECTED NONE DETECTED   Cocaine Metabolite,Ur [place] NONE DETECTED NONE DETECTED   Opiate, Ur Screen POSITIVE (A) NONE DETECTED   Phencyclidine (PCP) Ur S NONE DETECTED NONE DETECTED   Cannabinoid 50 Ng, Ur [place] NONE DETECTED NONE DETECTED   Barbiturates, Ur Screen NONE DETECTED NONE DETECTED   Benzodiazepine, Ur Scrn POSITIVE (A) NONE DETECTED   Methadone Scn, Ur NONE DETECTED NONE DETECTED  Pregnancy, urine   Collection Time: 09/27/18 12:14 PM  Result Value Ref Range   Preg Test, Ur NEGATIVE NEGATIVE  Pregnancy, urine POC   Collection Time: 09/27/18 12:16 PM  Result Value Ref Range   Preg Test, Ur NEGATIVE NEGATIVE  Results for orders placed or performed during the hospital encounter of 09/24/18 (from the past 336 hour(s))  Basic metabolic panel   Collection Time: 09/24/18 12:36 PM  Result Value Ref Range   Sodium 138 135 - 145 mmol/L   Potassium 3.5 3.5 - 5.1 mmol/L   Chloride 103 98 - 111 mmol/L   CO2 25 22 - 32 mmol/L   Glucose, Bld 116 (H) 70 - 99 mg/dL   BUN 18 6 - 20 mg/dL   Creatinine, Ser 1.06 (H) 0.44 - 1.00 mg/dL   Calcium 8.9 8.9 - 10.3 mg/dL   GFR calc non Af Amer >60 >60 mL/min   GFR calc Af Amer >60 >60 mL/min   Anion gap 10 5 - 15  CBC    Collection Time: 09/24/18 12:36 PM  Result Value Ref Range   WBC 6.8 4.0 - 10.5 K/uL   RBC 4.09 3.87 - 5.11 MIL/uL   Hemoglobin 13.1 12.0 - 15.0 g/dL   HCT 39.0 36.0 - 46.0 %   MCV 95.4 80.0 - 100.0 fL   MCH 32.0 26.0 - 34.0 pg   MCHC 33.6 30.0 - 36.0 g/dL   RDW 12.0 11.5 - 15.5 %   Platelets 258 150 - 400 K/uL   nRBC 0.0 0.0 - 0.2 %  Type and  screen [place]   Collection Time: 09/24/18 12:36 PM  Result Value Ref Range   ABO/RH(D) A POS    Antibody Screen NEG    Sample Expiration 10/08/2018,2359    Extend sample reason      NO TRANSFUSIONS OR PREGNANCY IN THE PAST 3 MONTHS Performed at [facility], [address] [place], [place] [address]   SARS CORONAVIRUS 2 (TAT 6-24 HRS) Nasopharyngeal Nasopharyngeal Swab   Collection Time: 09/24/18  1:37 PM   Specimen: Nasopharyngeal Swab  Result Value Ref Range   SARS Coronavirus 2 NEGATIVE NEGATIVE    Assessment: Patient Active Problem List   Diagnosis Date Noted  . Dysmenorrhea 05/01/2018  . Menorrhagia with irregular cycle 02/25/2014    Plan: Patient will undergo surgical management with Hysteroscopy, Novasure Endometrial Ablation.   The risks of surgery were discussed in detail with the patient including but not limited to: bleeding which may require transfusion or reoperation; infection which may require antibiotics; injury to surrounding organs which may involve bowel, bladder, ureters ; need for additional procedures including laparoscopy or laparotomy; thromboembolic phenomenon, surgical site problems and other postoperative/anesthesia complications. Likelihood of success in alleviating the patient's condition was discussed. Routine postoperative instructions will be reviewed with the patient and her family in detail after surgery.  The patient concurred with the proposed plan, giving informed written consent for the surgery.  Patient has been NPO since last night she will remain NPO for  procedure.  Anesthesia and OR aware.   ----- Larey Days, MD, [place] Attending Obstetrician and Gynecologist [facility], Department of [facility]

## 2018-09-27 NOTE — Anesthesia Procedure Notes (Signed)
Procedure Name: LMA Insertion Date/Time: 09/27/2018 3:48 PM Performed by: Doreen Salvage, CRNA Pre-anesthesia Checklist: Patient identified, Patient being monitored, Timeout performed, Emergency Drugs available and Suction available Patient Re-evaluated:Patient Re-evaluated prior to induction Oxygen Delivery Method: Circle system utilized Preoxygenation: Pre-oxygenation with 100% oxygen Induction Type: IV induction Ventilation: Mask ventilation without difficulty LMA: LMA inserted LMA Size: 3.5 Tube type: Oral Number of attempts: 1 Placement Confirmation: positive ETCO2 and breath sounds checked- equal and bilateral Tube secured with: Tape Dental Injury: Teeth and Oropharynx as per pre-operative assessment

## 2018-09-27 NOTE — Transfer of Care (Signed)
Immediate Anesthesia Transfer of Care Note  Patient: Judy Bailey  Procedure(s) Performed: Procedure(s): HYSTEROSCOPY WITH NOVASURE (N/A)  Patient Location: PACU  Anesthesia Type:General  Level of Consciousness: sedated  Airway & Oxygen Therapy: Patient Spontanous Breathing and Patient connected to face mask oxygen  Post-op Assessment: Report given to RN and Post -op Vital signs reviewed and stable  Post vital signs: Reviewed and stable  Last Vitals:  Vitals:   09/27/18 1152 09/27/18 1642  BP: 119/77 111/74  Pulse: 82 63  Resp: 18 14  Temp: (!) 35.9 C 36.6 C  SpO2: 123456 [id number]    Complications: No apparent anesthesia complications

## 2018-09-27 NOTE — Anesthesia Post-op Follow-up Note (Signed)
Anesthesia QCDR form completed.        

## 2018-09-29 ENCOUNTER — Encounter: Payer: Self-pay | Admitting: Obstetrics & Gynecology

## 2019-05-08 ENCOUNTER — Ambulatory Visit: Payer: Medicare Other | Attending: Internal Medicine

## 2019-05-08 DIAGNOSIS — Z23 Encounter for immunization: Secondary | ICD-10-CM

## 2019-05-08 NOTE — Progress Notes (Signed)
   Covid-19 Vaccination Clinic  Name:  Judy Bailey    MRN: [id number] DOB: [birth year]  05/08/2019  Judy Bailey was observed post Covid-19 immunization for 30 minutes based on pre-vaccination screening without incident. She was provided with Vaccine Information Sheet and instruction to access the V-Safe system.   Judy Bailey was instructed to call 911 with any severe reactions post vaccine: Marland Kitchen Difficulty breathing  . Swelling of face and throat  . A fast heartbeat  . A bad rash all over body  . Dizziness and weakness   Immunizations Administered    Name Date Dose VIS Date Route   Pfizer COVID-19 Vaccine 05/08/2019  1:01 PM 0.3 mL 01/03/2019 Intramuscular   Manufacturer: [place]   Lot: [id number]   [place]: [id number]

## 2019-06-03 ENCOUNTER — Ambulatory Visit: Payer: Medicare Other | Attending: Internal Medicine

## 2019-06-03 DIAGNOSIS — Z23 Encounter for immunization: Secondary | ICD-10-CM

## 2019-06-03 NOTE — Progress Notes (Signed)
   Covid-19 Vaccination Clinic  Name:  Judy Bailey    MRN: [id number] DOB: November 02, 1975  06/03/2019  [name] was observed post Covid-19 immunization for 15 minutes without incident. She was provided with Vaccine Information Sheet and instruction to access the V-Safe system.   [name] was instructed to call 911 with any severe reactions post vaccine: Marland Kitchen Difficulty breathing  . Swelling of face and throat  . A fast heartbeat  . A bad rash all over body  . Dizziness and weakness   Immunizations Administered    Name Date Dose VIS Date Route   Pfizer COVID-19 Vaccine 06/03/2019  1:15 PM 0.3 mL 03/19/2018 Intramuscular   Manufacturer: [place]   Lot: [id number]   [place]: [id number]

## 2019-10-24 ENCOUNTER — Encounter: Payer: Self-pay | Admitting: Emergency Medicine

## 2019-10-24 ENCOUNTER — Other Ambulatory Visit: Payer: Self-pay

## 2019-10-24 ENCOUNTER — Emergency Department
Admission: EM | Admit: 2019-10-24 | Discharge: 2019-10-24 | Disposition: A | Discharge status: Home / Self Care | Payer: Medicare Other | Attending: Emergency Medicine | Admitting: Emergency Medicine

## 2019-10-24 DIAGNOSIS — I10 Essential (primary) hypertension: Secondary | ICD-10-CM | POA: Insufficient documentation

## 2019-10-24 DIAGNOSIS — F1120 Opioid dependence, uncomplicated: Secondary | ICD-10-CM | POA: Insufficient documentation

## 2019-10-24 DIAGNOSIS — J449 Chronic obstructive pulmonary disease, unspecified: Secondary | ICD-10-CM | POA: Insufficient documentation

## 2019-10-24 DIAGNOSIS — F17210 Nicotine dependence, cigarettes, uncomplicated: Secondary | ICD-10-CM | POA: Diagnosis not present

## 2019-10-24 LAB — URINE DRUG SCREEN, QUALITATIVE (ARMC ONLY)
Amphetamines, Ur Screen: NOT DETECTED
Barbiturates, Ur Screen: NOT DETECTED
Benzodiazepine, Ur Scrn: POSITIVE — AB
Cannabinoid 50 Ng, Ur ~~LOC~~: NOT DETECTED
Cocaine Metabolite,Ur ~~LOC~~: NOT DETECTED
MDMA (Ecstasy)Ur Screen: NOT DETECTED
Methadone Scn, Ur: NOT DETECTED
Opiate, Ur Screen: POSITIVE — AB
Phencyclidine (PCP) Ur S: NOT DETECTED
Tricyclic, Ur Screen: NOT DETECTED

## 2019-10-24 LAB — URINALYSIS, COMPLETE (UACMP) WITH MICROSCOPIC
Bilirubin Urine: NEGATIVE
Glucose, UA: NEGATIVE mg/dL
Hgb urine dipstick: NEGATIVE
Ketones, ur: 20 mg/dL — AB
Nitrite: NEGATIVE
Protein, ur: 30 mg/dL — AB
Specific Gravity, Urine: 1.025 (ref 1.005–1.030)
pH: 8 (ref 5.0–8.0)

## 2019-10-24 LAB — COMPREHENSIVE METABOLIC PANEL
ALT: 23 U/L (ref 0–44)
AST: 25 U/L (ref 15–41)
Albumin: 4.2 g/dL (ref 3.5–5.0)
Alkaline Phosphatase: 57 U/L (ref 38–126)
Anion gap: 14 (ref 5–15)
BUN: 17 mg/dL (ref 6–20)
CO2: 22 mmol/L (ref 22–32)
Calcium: 9 mg/dL (ref 8.9–10.3)
Chloride: 102 mmol/L (ref 98–111)
Creatinine, Ser: 1.14 mg/dL — ABNORMAL HIGH (ref 0.44–1.00)
GFR calc Af Amer: >60 mL/min (ref >60)
GFR calc non Af Amer: 58 mL/min — ABNORMAL LOW (ref >60)
Glucose, Bld: 128 mg/dL — ABNORMAL HIGH (ref 70–99)
Potassium: 4.5 mmol/L (ref 3.5–5.1)
Sodium: 138 mmol/L (ref 135–145)
Total Bilirubin: 2.1 mg/dL — ABNORMAL HIGH (ref 0.3–1.2)
Total Protein: 7.3 g/dL (ref 6.5–8.1)

## 2019-10-24 LAB — CBC
HCT: 42.9 % (ref 36.0–46.0)
Hemoglobin: 16 g/dL — ABNORMAL HIGH (ref 12.0–15.0)
MCH: 32.9 pg (ref 26.0–34.0)
MCHC: 37.3 g/dL — ABNORMAL HIGH (ref 30.0–36.0)
MCV: 88.1 fL (ref 80.0–100.0)
Platelets: 307 10*3/uL (ref 150–400)
RBC: 4.87 MIL/uL (ref 3.87–5.11)
RDW: 11.4 % — ABNORMAL LOW (ref 11.5–15.5)
WBC: 8.7 10*3/uL (ref 4.0–10.5)
nRBC: 0 % (ref 0.0–0.2)

## 2019-10-24 LAB — SALICYLATE LEVEL: Salicylate Lvl: <7 mg/dL — ABNORMAL LOW (ref 7.0–30.0)

## 2019-10-24 LAB — ACETAMINOPHEN LEVEL: Acetaminophen (Tylenol), Serum: <10 ug/mL — ABNORMAL LOW (ref 10–30)

## 2019-10-24 LAB — ETHANOL: Alcohol, Ethyl (B): <10 mg/dL (ref <10)
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
## 2019-10-24 NOTE — ED Provider Notes (Signed)
[facility] Emergency Department Provider Note  ____________________________________________  Time seen: Approximately 7:02 PM  I have reviewed the triage vital signs and the nursing notes.   HISTORY  Chief Complaint Overdose    HPI Judy Bailey is a 44 y.o. female with a history of bipolar disorder, COPD, opiate dependence who reports that she snorts heroin daily, and had used earlier in the day.  Then this afternoon she was feeling bad with nausea, so she is part of a strip of Suboxone.  She was still feeling bad so she tried using Narcan that she had at home which made her feel much worse with nausea vomiting abdominal pain.  EMS gave Zofran which has helped her quite a bit.  Contrary to triage note, she denies SI HI or hallucinations.  She denies any attempt to hurt herself.  She replies "I have a son, I love him too much to do that."  Denies taking any extra medication or sedatives lately, denies any history of self-injurious behavior or suicide attempt.  No HI or hallucinations.      Past Medical History:  Diagnosis Date  . Absence of menstruation   . Agoraphobia with panic disorder   . Anxiety   . Attention deficit disorder without mention of hyperactivity   . Backache, unspecified   . Bipolar disorder ([place])   . Body mass index 30.0-30.9, adult   . Borderline personality disorder ([place])   . COPD (chronic obstructive pulmonary disease) ([place])   . Depressive disorder, not elsewhere classified   . Diabetes mellitus without complication ([place])    GESTATIONAL DM  . Disturbance of skin sensation   . Dyspepsia and other specified disorders of function of stomach   . History of kidney stones    CURRENTLY AS OF 09-19-18  . Insomnia, unspecified   . Leukorrhea, not specified as infective   . Night terrors   . Other and unspecified hyperlipidemia   . Other nonspecific finding on examination of urine   . Other symptoms involving digestive system(787.99)    . Palpitations   . PTSD (post-traumatic stress disorder)   . 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 Restless legs syndrome (RLS)   . Tobacco use disorder   . Trichomonal vulvovaginitis   . Unspecified sleep apnea    NO CPAP     Patient Active Problem List   Diagnosis Date Noted  . Dysmenorrhea 05/01/2018  . Bipolar 1 disorder ([place]) 05/26/2016  . Encounter to establish care 05/26/2016  . History of substance abuse ([place]) 05/26/2016  . Recurrent UTI 05/26/2016  . Prediabetes 05/26/2016  . COPD (chronic obstructive pulmonary disease) ([place]) 10/27/2015  . Depression 10/27/2015  . 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 Exertional dyspnea 05/18/2015  . Tobacco user 05/18/2015  . Hypoxemia 05/18/2015  . B12 deficiency 02/25/2014  . Fatigue 02/25/2014  . Recurrent pain of right knee 02/25/2014  . Menorrhagia with irregular cycle 02/25/2014  . BV (bacterial vaginosis) 04/24/2013  . Mild dysplasia of cervix 04/24/2013  . Furuncle of buttock 04/09/2013  . Pain 04/09/2013  . Papanicolaou smear of cervix with low grade squamous intraepithelial lesion (LGSIL) 03/10/2013  . Abnormal glucose 02/20/2013  . Pap smear with low grade squamous intraepithelial lesion (LGSIL) 02/20/2013  . Essential (primary) hypertension 02/18/2013  . Obesity 02/18/2013  . 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 Attention deficit disorder 09/25/2012  . Tremor, physiological 06/05/2012  . 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 Hyperlipidemia 04/26/2012  . Obstructive sleep apnea 04/26/2012  .  H/O knee surgery 04/26/2012  . H/O external ear surgery 04/26/2012  . Abnormal involuntary movement 02/09/2012  . Backache 02/07/2012  . Dyspepsia and disorder of function of stomach 06/14/2011  . Major depressive disorder, single episode, unspecified 06/13/2011  . Insomnia 06/13/2011     Past Surgical History:  Procedure Laterality Date  . ENDOMETRIAL ABLATION  09/27/2018   Procedure: ENDOMETRIAL ABLATION;  Surgeon: Ward,  Honor Loh, MD;  Location: [facility] ORS;  Service: Gynecology;;  . HYSTEROSCOPY WITH NOVASURE N/A 09/27/2018   Procedure: HYSTEROSCOPY WITH NOVASURE;  Surgeon: Ward, Honor Loh, MD;  Location: [facility] ORS;  Service: Gynecology;  Laterality: N/A;  . INNER EAR SURGERY Right   . KNEE CARTILAGE SURGERY     right  . tonsillectomy    . tympanic membrane  2009     Prior to Admission medications   Medication Sig Start Date End Date Taking? Authorizing Provider  ALPRAZolam Duanne Moron) 0.5 MG tablet Take 0.5 mg by mouth as needed for anxiety.    [provider]  amphetamine-dextroamphetamine (ADDERALL XR) 30 MG 24 hr capsule Take 30 mg by mouth every morning.    [provider]  desvenlafaxine (PRISTIQ) 100 MG 24 hr tablet Take 1 tablet (100 mg total) by mouth daily. Patient taking differently: Take 100 mg by mouth every morning.  09/03/12   Arfeen, Arlyce Harman, MD  ibuprofen (ADVIL) 800 MG tablet Take 1 tablet (800 mg total) by mouth every 6 (six) hours as needed. 09/27/18   Ward, Honor Loh, MD  zolpidem (AMBIEN) 10 MG tablet Take 10 mg by mouth at bedtime as needed for sleep.  09/22/16   [provider]     Allergies Patient has no known allergies.   Family History  Problem Relation Age of Onset  . Hypertension Father   . Diabetes Father   . Heart disease Father   . Depression Father   . Colon polyps Father   . Other Father        mad cow disease  . Colon polyps Mother   . Kidney disease Mother   . Irritable bowel syndrome Paternal Aunt   . Bipolar disorder Paternal Aunt   . Prostate cancer Maternal Grandfather   . Liver disease Maternal Grandfather   . Breast cancer Paternal Grandmother   . Diabetes Paternal Grandmother   . Bipolar disorder Paternal Grandmother   . Alzheimer's disease Paternal Grandfather   . Parkinson's disease Paternal Grandfather   . Hypertension Brother   . Bipolar disorder Sister   . Clotting disorder Maternal Grandmother   . Bipolar disorder Cousin    . Drug abuse Cousin   . Schizophrenia Maternal Aunt     Social History Social History   Tobacco Use  . Smoking status: Current Every Day Smoker    Packs/day: 1.00    Years: 22.00    Pack years: 22.00    Types: Cigarettes  . Smokeless tobacco: Never Used  Vaping Use  . Vaping Use: Never used  Substance Use Topics  . Alcohol use: No    Alcohol/week: 0.0 standard drinks  . Drug use: Yes    Types: Marijuana    Comment: last used over a month    Review of Systems  Constitutional:   No fever or chills.  ENT:   No sore throat. No rhinorrhea. Cardiovascular:   No chest pain or syncope. Respiratory:   No dyspnea or cough. Gastrointestinal:   Negative for abdominal pain, vomiting and diarrhea.  Positive dysuria Musculoskeletal:  Negative for focal pain or swelling All other systems reviewed and are negative except as documented above in ROS and HPI.  ____________________________________________   PHYSICAL EXAM:  VITAL SIGNS: ED Triage Vitals  Enc Vitals Group     BP 10/24/19 1755 97/80     Pulse Rate 10/24/19 1755 76     Resp 10/24/19 1755 (!) 22     Temp 10/24/19 1755 98.3 F (36.8 C)     Temp Source 10/24/19 1755 Oral     SpO2 10/24/19 1755 100 %     Weight 10/24/19 1756 160 lb (72.6 kg)     Height 10/24/19 1756 5\' 9"  (1.753 m)     Head Circumference --      Peak Flow --      Pain Score 10/24/19 1756 0     Pain Loc --      Pain Edu? --      Excl. in [place]? --     Vital signs reviewed, nursing assessments reviewed.   Constitutional:   Alert and oriented. Non-toxic appearance. Eyes:   Conjunctivae are normal. EOMI. PERRL. ENT      Head:   Normocephalic and atraumatic.      Nose:   Wearing a mask.      Mouth/Throat:   Wearing a mask.      Neck:   No meningismus. Full ROM. Hematological/Lymphatic/Immunilogical:   No cervical lymphadenopathy. Cardiovascular:   RRR. Symmetric bilateral radial and DP pulses.  No murmurs. Cap refill less than 2  seconds. Respiratory:   Normal respiratory effort without tachypnea/retractions. Breath sounds are clear and equal bilaterally. No wheezes/rales/rhonchi. Gastrointestinal: Normoactive bowel sounds.  Soft with mild suprapubic tenderness. Non distended. There is no CVA tenderness.  No rebound, rigidity, or guarding.  Musculoskeletal:   Normal range of motion in all extremities. No joint effusions.  No lower extremity tenderness.  No edema. Neurologic:   Normal speech and language.  Motor grossly intact. No acute focal neurologic deficits are appreciated.  Skin:    Skin is warm, dry and intact. No rash noted.  No petechiae, purpura, or bullae.  ____________________________________________    LABS (pertinent positives/negatives) (all labs ordered are listed, but only abnormal results are displayed) Labs Reviewed  COMPREHENSIVE METABOLIC PANEL - Abnormal; Notable for the following components:      Result Value   Glucose, Bld 128 (*)    Creatinine, Ser 1.14 (*)    Total Bilirubin 2.1 (*)    GFR calc non Af Amer 58 (*)    All other components within normal limits  SALICYLATE LEVEL - Abnormal; Notable for the following components:   Salicylate Lvl <3.8 (*)    All other components within normal limits  ACETAMINOPHEN LEVEL - Abnormal; Notable for the following components:   Acetaminophen (Tylenol), Serum <10 (*)    All other components within normal limits  CBC - Abnormal; Notable for the following components:   Hemoglobin 16.0 (*)    MCHC 37.3 (*)    RDW 11.4 (*)    All other components within normal limits  URINE DRUG SCREEN, QUALITATIVE ([facility] ONLY) - Abnormal; Notable for the following components:   Opiate, Ur Screen POSITIVE (*)    Benzodiazepine, Ur Scrn POSITIVE (*)    All other components within normal limits  URINALYSIS, COMPLETE (UACMP) WITH MICROSCOPIC - Abnormal; Notable for the following components:   Color, Urine YELLOW (*)    APPearance HAZY (*)    Ketones, ur 20 (*)  Protein, ur 30 (*)    Leukocytes,Ua SMALL (*)    Bacteria, UA RARE (*)    All other components within normal limits  ETHANOL  PREGNANCY, URINE   ____________________________________________   EKG    ____________________________________________    RADIOLOGY  No results found.  ____________________________________________   PROCEDURES Procedures  ____________________________________________    CLINICAL IMPRESSION / ASSESSMENT AND PLAN / ED COURSE  Medications ordered in the ED: Medications - No data to display  Pertinent labs & imaging results that were available during my care of the patient were reviewed by me and considered in my medical decision making (see chart for details).  SAMARIE PINDER was evaluated in Emergency Department on 10/24/2019 for the symptoms described in the history of present illness. She was evaluated in the context of the global COVID-19 pandemic, which necessitated consideration that the patient might be at risk for infection with the SARS-CoV-2 virus that causes COVID-19. Institutional protocols and algorithms that pertain to the evaluation of patients at risk for COVID-19 are in a state of rapid change based on information released by regulatory bodies including the CDC and federal and state organizations. These policies and algorithms were followed during the patient's care in the ED.   Patient brought to ED with some opiate withdrawal symptoms after using Narcan at home.  Currently vital signs are normal, not in distress, nontoxic, exam is reassuring.  Presentation suggestive of cystitis, so will check urinalysis.  It appears the patient's opiate abuse and dependence is causing significant dysfunction in her life and her relationship with her son, so I will consult TTS for recommendations.  Clinical Course as of Oct 24 2143  Fri Oct 24, 2019  2025 Pt wishes to go home, but is willing to wait a little longer for psych eval. Still reports feeling  safe, no intent to harm herself. Not commitable, but may benefit from psych consult.    [PS]    Clinical Course User Index [PS] Carrie Mew, MD    ----------------------------------------- 9:46 PM on 10/24/2019 ----------------------------------------- Patient seen by psych team, cleared for discharge.  She is calm, contracts for safety, stable for discharge home, given resources by psych team.    ____________________________________________   FINAL CLINICAL IMPRESSION(S) / ED DIAGNOSES    Final diagnoses:  Uncomplicated opioid dependence [facility])     ED Discharge Orders    None      Portions of this note were generated with dragon dictation software. Dictation errors may occur despite best attempts at proofreading.   Carrie Mew, MD 10/24/19 2146

## 2019-10-24 NOTE — ED Notes (Signed)
Pt states EMS brought her reading glasses with them but they are not in triage nor were they given to this RN. Pt states she does not have them on her person. [facility] called to find out what unit transported in attempt to locate the glasses.

## 2019-10-24 NOTE — BH Assessment (Signed)
Referral Information faxed to the following facility at the request of the patient:   [facility]

## 2019-10-24 NOTE — ED Triage Notes (Addendum)
Pt arrived to ED via [facility]. Pt states that she took heroin, suboxin, and then gave herself narcan that she had at home.   EMS gave pt IV zofran in route to EMS  Pt states that she snorts heroin daily.   Pt confirmed that she is having thoughts of hurting herself at this time. Pt states that she "took a bunch of sleeping pills" in order to try and kill herself a few days ago.

## 2023-11-09 ENCOUNTER — Other Ambulatory Visit: Payer: Self-pay | Admitting: Physician Assistant

## 2023-11-09 DIAGNOSIS — Z1231 Encounter for screening mammogram for malignant neoplasm of breast: Secondary | ICD-10-CM

## 2023-11-23 ENCOUNTER — Encounter
